# Patient Record
Sex: Female | Born: 1955 | Race: White | Hispanic: No | Smoking: Former smoker
Health system: Southern US, Community
[De-identification: ages and names within clinical notes are randomized; demographics above are authoritative.]

## PROBLEM LIST (undated history)

## (undated) DIAGNOSIS — F329 Major depressive disorder, single episode, unspecified: Secondary | ICD-10-CM

## (undated) DIAGNOSIS — R0600 Dyspnea, unspecified: Secondary | ICD-10-CM

## (undated) DIAGNOSIS — J449 Chronic obstructive pulmonary disease, unspecified: Secondary | ICD-10-CM

## (undated) DIAGNOSIS — F32A Depression, unspecified: Secondary | ICD-10-CM

## (undated) DIAGNOSIS — Z9889 Other specified postprocedural states: Secondary | ICD-10-CM

## (undated) DIAGNOSIS — T4145XA Adverse effect of unspecified anesthetic, initial encounter: Secondary | ICD-10-CM

## (undated) DIAGNOSIS — R112 Nausea with vomiting, unspecified: Secondary | ICD-10-CM

## (undated) DIAGNOSIS — T8859XA Other complications of anesthesia, initial encounter: Secondary | ICD-10-CM

## (undated) DIAGNOSIS — M1990 Unspecified osteoarthritis, unspecified site: Secondary | ICD-10-CM

## (undated) HISTORY — PX: CHOLECYSTECTOMY: SHX55

## (undated) HISTORY — PX: TUBAL LIGATION: SHX77

## (undated) HISTORY — PX: EAR CANALOPLASTY: SHX1481

---

## 2016-03-05 HISTORY — PX: KNEE ARTHROSCOPY: SHX127

## 2016-06-30 ENCOUNTER — Ambulatory Visit: Payer: Self-pay | Admitting: Physician Assistant

## 2016-07-11 ENCOUNTER — Encounter: Payer: Worker's Compensation | Admitting: Vascular Surgery

## 2016-07-12 ENCOUNTER — Encounter (HOSPITAL_COMMUNITY): Payer: Self-pay | Admitting: *Deleted

## 2016-07-12 ENCOUNTER — Encounter (HOSPITAL_COMMUNITY)
Admission: RE | Admit: 2016-07-12 | Discharge: 2016-07-12 | Disposition: A | Discharge status: Home / Self Care | Payer: Worker's Compensation | Source: Ambulatory Visit | Attending: Orthopedic Surgery | Admitting: Orthopedic Surgery

## 2016-07-12 ENCOUNTER — Other Ambulatory Visit: Payer: Self-pay

## 2016-07-12 DIAGNOSIS — Z01818 Encounter for other preprocedural examination: Secondary | ICD-10-CM | POA: Diagnosis not present

## 2016-07-12 DIAGNOSIS — M5137 Other intervertebral disc degeneration, lumbosacral region: Secondary | ICD-10-CM | POA: Insufficient documentation

## 2016-07-12 HISTORY — DX: Chronic obstructive pulmonary disease, unspecified: J44.9

## 2016-07-12 HISTORY — DX: Unspecified osteoarthritis, unspecified site: M19.90

## 2016-07-12 HISTORY — DX: Other specified postprocedural states: Z98.890

## 2016-07-12 HISTORY — DX: Nausea with vomiting, unspecified: R11.2

## 2016-07-12 HISTORY — DX: Dyspnea, unspecified: R06.00

## 2016-07-12 HISTORY — DX: Other complications of anesthesia, initial encounter: T88.59XA

## 2016-07-12 HISTORY — DX: Adverse effect of unspecified anesthetic, initial encounter: T41.45XA

## 2016-07-12 LAB — BASIC METABOLIC PANEL
ANION GAP: 9 (ref 5–15)
BUN: 5 mg/dL — ABNORMAL LOW (ref 6–20)
CALCIUM: 9.5 mg/dL (ref 8.9–10.3)
CO2: 27 mmol/L (ref 22–32)
Chloride: 102 mmol/L (ref 101–111)
Creatinine, Ser: 0.73 mg/dL (ref 0.44–1.00)
GLUCOSE: 101 mg/dL — AB (ref 65–99)
POTASSIUM: 3.7 mmol/L (ref 3.5–5.1)
Sodium: 138 mmol/L (ref 135–145)

## 2016-07-12 LAB — SURGICAL PCR SCREEN
MRSA, PCR: NEGATIVE
STAPHYLOCOCCUS AUREUS: NEGATIVE

## 2016-07-12 LAB — CBC
HEMATOCRIT: 41.6 % (ref 36.0–46.0)
Hemoglobin: 14.2 g/dL (ref 12.0–15.0)
MCH: 33.3 pg (ref 26.0–34.0)
MCHC: 34.1 g/dL (ref 30.0–36.0)
MCV: 97.7 fL (ref 78.0–100.0)
PLATELETS: 386 10*3/uL (ref 150–400)
RBC: 4.26 MIL/uL (ref 3.87–5.11)
RDW: 13.2 % (ref 11.5–15.5)
WBC: 6.7 10*3/uL (ref 4.0–10.5)

## 2016-07-12 LAB — TYPE AND SCREEN
ABO/RH(D): A POS
ANTIBODY SCREEN: NEGATIVE

## 2016-07-12 LAB — ABO/RH: ABO/RH(D): A POS

## 2016-07-12 NOTE — Progress Notes (Signed)
Spoke with sherry at dr Shon Batonbrooks office re: [facility] assist- she will call [facility] office and remind to put in orders for dr early

## 2016-07-12 NOTE — Pre-Procedure Instructions (Addendum)
Catherine Lang  07/12/2016      [facility] [place] [place] - [address] [place] [address] Phone: [phone] Fax: [phone]    Your procedure is scheduled on 07/19/16  [facility] at 630 A.M.  Call this number if you have problems the morning of surgery:  [phone]   Remember:  Do not eat food or drink liquids after midnight.  Take these medicines the morning of surgery with A SIP OF WATER     Tylenol if needed  STOP all herbel meds, nsaids (aleve,naproxen,advil,ibuprofen)  Starting Today including all vitamins/supplements, aspirin,celebrex,probiotic   Do not wear jewelry, make-up or nail polish.  Do not wear lotions, powders, or perfumes, or deoderant.  Do not shave 48 hours prior to surgery.  Men may shave face and neck.  Do not bring valuables to the hospital.  [facility] is not responsible for any belongings or valuables.  Contacts, dentures or bridgework may not be worn into surgery.  Leave your suitcase in the car.  After surgery it may be brought to your room.  For patients admitted to the hospital, discharge time will be determined by your treatment team.  Patients discharged the day of surgery will not be allowed to drive home.   Special instructions:   Special Instructions:  - Preparing for Surgery  Before surgery, you can play an important role.  Because skin is not sterile, your skin needs to be as free of germs as possible.  You can reduce the number of germs on you skin by washing with CHG (chlorahexidine gluconate) soap before surgery.  CHG is an antiseptic cleaner which kills germs and bonds with the skin to continue killing germs even after washing.  Please DO NOT use if you have an allergy to CHG or antibacterial soaps.  If your skin becomes reddened/irritated stop using the CHG and inform your nurse when you arrive at Short Stay.  Do not shave  (including legs and underarms) for at least 48 hours prior to the first CHG shower.  You may shave your face.  Please follow these instructions carefully:   1.  Shower with CHG Soap the night before surgery and the morning of Surgery.  2.  If you choose to wash your hair, wash your hair first as usual with your normal shampoo.  3.  After you shampoo, rinse your hair and body thoroughly to remove the Shampoo.  4.  Use CHG as you would any other liquid soap.  You can apply chg directly  to the skin and wash gently with scrungie or a clean washcloth.  5.  Apply the CHG Soap to your body ONLY FROM THE NECK DOWN.  Do not use on open wounds or open sores.  Avoid contact with your eyes ears, mouth and genitals (private parts).  Wash genitals (private parts)       with your normal soap.  6.  Wash thoroughly, paying special attention to the area where your surgery will be performed.  7.  Thoroughly rinse your body with warm water from the neck down.  8.  DO NOT shower/wash with your normal soap after using and rinsing off the CHG Soap.  9.  Pat yourself dry with a clean towel.            10.  Wear clean pajamas.            11.  Place  clean sheets on your bed the night of your first shower and do not sleep with pets.  Day of Surgery  Do not apply any lotions/deodorants the morning of surgery.  Please wear clean clothes to the hospital/surgery center.  Please read over the  fact sheets that you were given.

## 2016-07-18 ENCOUNTER — Ambulatory Visit (INDEPENDENT_AMBULATORY_CARE_PROVIDER_SITE_OTHER): Payer: Worker's Compensation | Admitting: Vascular Surgery

## 2016-07-18 ENCOUNTER — Encounter: Payer: Self-pay | Admitting: Vascular Surgery

## 2016-07-18 VITALS — BP 122/77 | HR 92 | Temp 98.1°F | Resp 18 | Ht 62.0 in | Wt 129.8 lb

## 2016-07-18 DIAGNOSIS — M5137 Other intervertebral disc degeneration, lumbosacral region: Secondary | ICD-10-CM | POA: Diagnosis not present

## 2016-07-18 NOTE — Progress Notes (Signed)
[facility]  Patient name: Catherine Lang MRN: [id number] DOB: [birth year] Sex: female  REASON FOR CONSULT: Discuss anterior exposure for L5-S1 disc surgery  HPI: Catherine Lang is a 61 y.o. female, who is seen today for discussion of my role for anterior exposure for L5-S1 disc surgery. She is seen Dr. Shon BatonBrooks who is recommended anterior repair. She had a injury while at work with the fall from a ladder. She had a knee injury and also back injury. She has failed conservative treatment. She has chronic pain in her back but also particularly down both legs more so left than her right. She has failed conservative treatment. Does have a history of tubal ligation with a low Pfannenstiel incision. Also history of cholecystectomy in the mid 1970s with a right paramedian incision. Has no history of cardiac disease. Does have a history of COPD  Past Medical History:  Diagnosis Date  . Arthritis   . Complication of anesthesia    yrs ago had Nausea but no problem in 10/17  . COPD (chronic obstructive pulmonary disease) (HCC)    when cough hard throw up- 1-2 tx week  . Dyspnea    with exersion  . PONV (postoperative nausea and vomiting)     History reviewed. No pertinent family history.  SOCIAL HISTORY: Social History   Social History  . Marital status: Widowed    Spouse name: N/A  . Number of children: N/A  . Years of education: N/A   Occupational History  . Not on file.   Social History Main Topics  . Smoking status: Former Smoker    Packs/day: 1.50    Years: 30.00    Quit date: 07/12/2013  . Smokeless tobacco: Never Used  . Alcohol use 10.8 oz/week    18 Cans of beer per week  . Drug use: No  . Sexual activity: Not on file   Other Topics Concern  . Not on file   Social History Narrative  . No narrative on file    Allergies  Allergen Reactions  . Tramadol Itching  . Hydrocodone Itching    Current Outpatient  Prescriptions  Medication Sig Dispense Refill  . acetaminophen (TYLENOL) 500 MG tablet Take 1,000 mg by mouth every 8 (eight) hours as needed for mild pain or moderate pain.    . celecoxib (CELEBREX) 200 MG capsule Take 200 mg by mouth at bedtime.    . Probiotic Product (PROBIOTIC DAILY PO) Take 1 capsule by mouth daily.     No current facility-administered medications for this visit.     REVIEW OF SYSTEMS:  [X]  denotes positive finding, [ ]  denotes negative finding Cardiac  Comments:  Chest pain or chest pressure:    Shortness of breath upon exertion:    Short of breath when lying flat:    Irregular heart rhythm:        Vascular    Pain in calf, thigh, or hip brought on by ambulation:    Pain in feet at night that wakes you up from your sleep:     Blood clot in your veins:    Leg swelling:         Pulmonary    Oxygen at home:    Productive cough:     Wheezing:         Neurologic    Sudden weakness in arms or legs:     Sudden numbness in arms or legs:     Sudden onset of  difficulty speaking or slurred speech:    Temporary loss of vision in one eye:     Problems with dizziness:         Gastrointestinal    Blood in stool:     Vomited blood:         Genitourinary    Burning when urinating:     Blood in urine:        Psychiatric    Major depression:         Hematologic    Bleeding problems:    Problems with blood clotting too easily:        Skin    Rashes or ulcers:        Constitutional    Fever or chills:      PHYSICAL EXAM: Vitals:   07/18/16 1101  BP: 122/77  Pulse: 92  Resp: 18  Temp: 98.1 F (36.7 C)  TempSrc: Oral  SpO2: 98%  Weight: 129 lb 12.8 oz (58.9 kg)  Height: 5\' 2"  (1.575 m)    GENERAL: The patient is a well-nourished female, in no acute distress. The vital signs are documented above. CARDIOVASCULAR: Carotid arteries without bruits bilaterally. 2+ radial and 2+ dorsalis pedis pulses bilaterally PULMONARY: There is good air exchange    ABDOMEN: Soft and non-tender . Mild obesity. Well-healed right paramedian incision MUSCULOSKELETAL: There are no major deformities or cyanosis. NEUROLOGIC: No focal weakness or paresthesias are detected. SKIN: There are no ulcers or rashes noted. PSYCHIATRIC: The patient has a normal affect.   MEDICAL ISSUES: Had long discussion with patient and her daughter present. Explained my role for exposure for L5-S1 disc surgery. Explained mobilization of the rectus muscle and entering the peritoneum. Explain mobilization of the left ureter and great vessels overlying the spine. Explain the potential for injury to all of these. She understands and wishes to proceed with surgery as scheduled tomorrow   Larina Earthly, MD [place]  [facility] Tel [phone] Pager [phone]

## 2016-07-19 ENCOUNTER — Inpatient Hospital Stay (HOSPITAL_COMMUNITY)
Admission: RE | Admit: 2016-07-19 | Discharge: 2016-07-20 | DRG: 460 | Disposition: A | Discharge status: Home / Self Care | Payer: Worker's Compensation | Source: Ambulatory Visit | Attending: Orthopedic Surgery | Admitting: Orthopedic Surgery

## 2016-07-19 ENCOUNTER — Inpatient Hospital Stay (HOSPITAL_COMMUNITY): Payer: Worker's Compensation

## 2016-07-19 ENCOUNTER — Encounter (HOSPITAL_COMMUNITY): Payer: Self-pay | Admitting: Urology

## 2016-07-19 ENCOUNTER — Inpatient Hospital Stay (HOSPITAL_COMMUNITY): Payer: Worker's Compensation | Admitting: Certified Registered"

## 2016-07-19 ENCOUNTER — Encounter (HOSPITAL_COMMUNITY)
Admission: RE | Disposition: A | Discharge status: Home / Self Care | Payer: Self-pay | Source: Ambulatory Visit | Attending: Orthopedic Surgery

## 2016-07-19 DIAGNOSIS — M549 Dorsalgia, unspecified: Secondary | ICD-10-CM | POA: Diagnosis present

## 2016-07-19 DIAGNOSIS — Z8261 Family history of arthritis: Secondary | ICD-10-CM | POA: Diagnosis not present

## 2016-07-19 DIAGNOSIS — Z79899 Other long term (current) drug therapy: Secondary | ICD-10-CM

## 2016-07-19 DIAGNOSIS — Z981 Arthrodesis status: Secondary | ICD-10-CM

## 2016-07-19 DIAGNOSIS — M5137 Other intervertebral disc degeneration, lumbosacral region: Principal | ICD-10-CM | POA: Diagnosis present

## 2016-07-19 DIAGNOSIS — M2578 Osteophyte, vertebrae: Secondary | ICD-10-CM | POA: Diagnosis present

## 2016-07-19 DIAGNOSIS — M1712 Unilateral primary osteoarthritis, left knee: Secondary | ICD-10-CM | POA: Diagnosis present

## 2016-07-19 DIAGNOSIS — J449 Chronic obstructive pulmonary disease, unspecified: Secondary | ICD-10-CM | POA: Diagnosis present

## 2016-07-19 DIAGNOSIS — Z87891 Personal history of nicotine dependence: Secondary | ICD-10-CM

## 2016-07-19 DIAGNOSIS — Z8249 Family history of ischemic heart disease and other diseases of the circulatory system: Secondary | ICD-10-CM

## 2016-07-19 DIAGNOSIS — Z825 Family history of asthma and other chronic lower respiratory diseases: Secondary | ICD-10-CM | POA: Diagnosis not present

## 2016-07-19 DIAGNOSIS — Z8262 Family history of osteoporosis: Secondary | ICD-10-CM

## 2016-07-19 DIAGNOSIS — M5442 Lumbago with sciatica, left side: Secondary | ICD-10-CM | POA: Diagnosis present

## 2016-07-19 DIAGNOSIS — Z419 Encounter for procedure for purposes other than remedying health state, unspecified: Secondary | ICD-10-CM

## 2016-07-19 HISTORY — PX: ANTERIOR LUMBAR FUSION: SHX1170

## 2016-07-19 HISTORY — PX: ABDOMINAL EXPOSURE: SHX5708

## 2016-07-19 LAB — CBC
HEMATOCRIT: 41.3 % (ref 36.0–46.0)
Hemoglobin: 13.8 g/dL (ref 12.0–15.0)
MCH: 33.1 pg (ref 26.0–34.0)
MCHC: 33.4 g/dL (ref 30.0–36.0)
MCV: 99 fL (ref 78.0–100.0)
Platelets: 353 10*3/uL (ref 150–400)
RBC: 4.17 MIL/uL (ref 3.87–5.11)
RDW: 13.3 % (ref 11.5–15.5)
WBC: 13.4 10*3/uL — ABNORMAL HIGH (ref 4.0–10.5)

## 2016-07-19 LAB — CREATININE, SERUM
Creatinine, Ser: 0.76 mg/dL (ref 0.44–1.00)
GFR calc non Af Amer: >60 mL/min (ref >60)

## 2016-07-19 SURGERY — ANTERIOR LUMBAR FUSION 1 LEVEL
Anesthesia: General

## 2016-07-19 MED ORDER — MENTHOL 3 MG MT LOZG: 1.0000 | LOZENGE | OROMUCOSAL | Status: DC | PRN

## 2016-07-19 MED ORDER — OXYCODONE HCL 5 MG PO TABS
10.0000 mg | ORAL_TABLET | ORAL | Status: DC | PRN
  Administered 2016-07-19 – 2016-07-20 (×4): 10 mg via ORAL
  Filled 2016-07-19 (×4): qty 2

## 2016-07-19 MED ORDER — ENOXAPARIN SODIUM 40 MG/0.4ML ~~LOC~~ SOLN: 40.0000 mg | SUBCUTANEOUS | 0 refills | Status: DC

## 2016-07-19 MED ORDER — POLYETHYLENE GLYCOL 3350 17 G PO PACK: 17.0000 g | PACK | Freq: Every day | ORAL | Status: DC | PRN

## 2016-07-19 MED ORDER — FENTANYL CITRATE (PF) 100 MCG/2ML IJ SOLN
25.0000 ug | INTRAMUSCULAR | Status: DC | PRN
  Administered 2016-07-19 (×2): 50 ug via INTRAVENOUS

## 2016-07-19 MED ORDER — ACETAMINOPHEN 325 MG PO TABS: 650.0000 mg | ORAL_TABLET | ORAL | Status: DC | PRN

## 2016-07-19 MED ORDER — METHOCARBAMOL 1000 MG/10ML IJ SOLN
500.0000 mg | Freq: Four times a day (QID) | INTRAVENOUS | Status: DC | PRN
  Filled 2016-07-19: qty 5

## 2016-07-19 MED ORDER — SODIUM CHLORIDE 0.9% FLUSH: 3.0000 mL | Freq: Two times a day (BID) | INTRAVENOUS | Status: DC

## 2016-07-19 MED ORDER — LACTATED RINGERS IV SOLN: INTRAVENOUS | Status: DC

## 2016-07-19 MED ORDER — MAGNESIUM CITRATE PO SOLN
0.5000 | Freq: Once | ORAL | Status: AC
  Administered 2016-07-20: 0.5 via ORAL
  Filled 2016-07-19: qty 296

## 2016-07-19 MED ORDER — THROMBIN 20000 UNITS EX SOLR
CUTANEOUS | Status: AC
  Filled 2016-07-19: qty 20000

## 2016-07-19 MED ORDER — SODIUM CHLORIDE 0.9% FLUSH: 3.0000 mL | INTRAVENOUS | Status: DC | PRN

## 2016-07-19 MED ORDER — CHLORHEXIDINE GLUCONATE 4 % EX LIQD: 60.0000 mL | Freq: Once | CUTANEOUS | Status: DC

## 2016-07-19 MED ORDER — SUGAMMADEX SODIUM 200 MG/2ML IV SOLN
INTRAVENOUS | Status: DC | PRN
  Administered 2016-07-19: 120 mg via INTRAVENOUS

## 2016-07-19 MED ORDER — BUPIVACAINE HCL (PF) 0.25 % IJ SOLN
INTRAMUSCULAR | Status: AC
  Filled 2016-07-19: qty 30

## 2016-07-19 MED ORDER — ENOXAPARIN SODIUM 30 MG/0.3ML ~~LOC~~ SOLN: 30.0000 mg | SUBCUTANEOUS | Status: DC

## 2016-07-19 MED ORDER — OXYCODONE-ACETAMINOPHEN 10-325 MG PO TABS: 1.0000 | ORAL_TABLET | ORAL | 0 refills | Status: DC | PRN

## 2016-07-19 MED ORDER — CEFAZOLIN SODIUM-DEXTROSE 2-4 GM/100ML-% IV SOLN
2.0000 g | INTRAVENOUS | Status: AC
  Administered 2016-07-19: 2 g via INTRAVENOUS

## 2016-07-19 MED ORDER — PHENYLEPHRINE HCL 10 MG/ML IJ SOLN
INTRAMUSCULAR | Status: DC | PRN
  Administered 2016-07-19 (×2): 40 ug via INTRAVENOUS
  Administered 2016-07-19: 80 ug via INTRAVENOUS
  Administered 2016-07-19: 40 ug via INTRAVENOUS
  Administered 2016-07-19: 80 ug via INTRAVENOUS

## 2016-07-19 MED ORDER — OXYCODONE-ACETAMINOPHEN 5-325 MG PO TABS: 1.0000 | ORAL_TABLET | ORAL | Status: DC | PRN

## 2016-07-19 MED ORDER — DOCUSATE SODIUM 100 MG PO CAPS: 100.0000 mg | ORAL_CAPSULE | Freq: Two times a day (BID) | ORAL | Status: DC

## 2016-07-19 MED ORDER — MORPHINE SULFATE (PF) 2 MG/ML IV SOLN: 2.0000 mg | INTRAVENOUS | Status: DC | PRN

## 2016-07-19 MED ORDER — ROCURONIUM BROMIDE 50 MG/5ML IV SOSY
PREFILLED_SYRINGE | INTRAVENOUS | Status: AC
  Filled 2016-07-19: qty 5

## 2016-07-19 MED ORDER — DEXAMETHASONE 4 MG PO TABS
4.0000 mg | ORAL_TABLET | Freq: Four times a day (QID) | ORAL | Status: AC
  Administered 2016-07-19: 4 mg via ORAL
  Filled 2016-07-19: qty 1

## 2016-07-19 MED ORDER — DEXAMETHASONE SODIUM PHOSPHATE 4 MG/ML IJ SOLN
4.0000 mg | Freq: Four times a day (QID) | INTRAMUSCULAR | Status: AC
  Administered 2016-07-20: 4 mg via INTRAVENOUS
  Filled 2016-07-19 (×2): qty 1

## 2016-07-19 MED ORDER — ROCURONIUM BROMIDE 10 MG/ML (PF) SYRINGE
PREFILLED_SYRINGE | INTRAVENOUS | Status: DC | PRN
  Administered 2016-07-19: 20 mg via INTRAVENOUS
  Administered 2016-07-19: 10 mg via INTRAVENOUS
  Administered 2016-07-19: 50 mg via INTRAVENOUS
  Administered 2016-07-19: 10 mg via INTRAVENOUS

## 2016-07-19 MED ORDER — ACETAMINOPHEN 10 MG/ML IV SOLN
1000.0000 mg | Freq: Four times a day (QID) | INTRAVENOUS | Status: DC
  Administered 2016-07-19 – 2016-07-20 (×3): 1000 mg via INTRAVENOUS
  Filled 2016-07-19 (×4): qty 100

## 2016-07-19 MED ORDER — ONDANSETRON HCL 4 MG/2ML IJ SOLN: 4.0000 mg | Freq: Once | INTRAMUSCULAR | Status: DC | PRN

## 2016-07-19 MED ORDER — CEFAZOLIN SODIUM-DEXTROSE 2-4 GM/100ML-% IV SOLN
2.0000 g | Freq: Three times a day (TID) | INTRAVENOUS | Status: DC
  Filled 2016-07-19: qty 100

## 2016-07-19 MED ORDER — MAGNESIUM CITRATE PO SOLN: 0.5000 | Freq: Once | ORAL | Status: DC

## 2016-07-19 MED ORDER — ACETAMINOPHEN 10 MG/ML IV SOLN
INTRAVENOUS | Status: AC
  Filled 2016-07-19: qty 100

## 2016-07-19 MED ORDER — THROMBIN 20000 UNITS EX SOLR
OROMUCOSAL | Status: DC | PRN
  Administered 2016-07-19: 11:00:00 via TOPICAL

## 2016-07-19 MED ORDER — OXYCODONE HCL 5 MG PO TABS
10.0000 mg | ORAL_TABLET | ORAL | Status: DC | PRN
Start: 2016-07-19 — End: 2016-07-19
  Administered 2016-07-19: 10 mg via ORAL

## 2016-07-19 MED ORDER — FENTANYL CITRATE (PF) 100 MCG/2ML IJ SOLN
INTRAMUSCULAR | Status: AC
  Filled 2016-07-19: qty 2

## 2016-07-19 MED ORDER — ACETAMINOPHEN 650 MG RE SUPP: 650.0000 mg | RECTAL | Status: DC | PRN

## 2016-07-19 MED ORDER — ACETAMINOPHEN 10 MG/ML IV SOLN: 1000.0000 mg | Freq: Four times a day (QID) | INTRAVENOUS | Status: DC

## 2016-07-19 MED ORDER — ENOXAPARIN SODIUM 40 MG/0.4ML ~~LOC~~ SOLN: 40.0000 mg | SUBCUTANEOUS | Status: DC

## 2016-07-19 MED ORDER — LIDOCAINE 2% (20 MG/ML) 5 ML SYRINGE
INTRAMUSCULAR | Status: DC | PRN
  Administered 2016-07-19: 60 mg via INTRAVENOUS

## 2016-07-19 MED ORDER — ONDANSETRON HCL 4 MG/2ML IJ SOLN
4.0000 mg | INTRAMUSCULAR | Status: DC | PRN
Start: 2016-07-19 — End: 2016-07-19

## 2016-07-19 MED ORDER — PHENYLEPHRINE HCL 10 MG/ML IJ SOLN
INTRAVENOUS | Status: DC | PRN
  Administered 2016-07-19: 25 ug/min via INTRAVENOUS

## 2016-07-19 MED ORDER — PHENOL 1.4 % MT LIQD: 1.0000 | OROMUCOSAL | Status: DC | PRN

## 2016-07-19 MED ORDER — ONDANSETRON HCL 4 MG/2ML IJ SOLN
4.0000 mg | INTRAMUSCULAR | Status: DC | PRN
  Administered 2016-07-19 – 2016-07-20 (×4): 4 mg via INTRAVENOUS
  Filled 2016-07-19 (×4): qty 2

## 2016-07-19 MED ORDER — METHOCARBAMOL 500 MG PO TABS: 500.0000 mg | ORAL_TABLET | Freq: Three times a day (TID) | ORAL | 0 refills | Status: DC | PRN

## 2016-07-19 MED ORDER — DEXAMETHASONE SODIUM PHOSPHATE 10 MG/ML IJ SOLN
INTRAMUSCULAR | Status: DC | PRN
  Administered 2016-07-19: 10 mg via INTRAVENOUS

## 2016-07-19 MED ORDER — ENOXAPARIN SODIUM 40 MG/0.4ML ~~LOC~~ SOLN
40.0000 mg | SUBCUTANEOUS | Status: DC
  Administered 2016-07-20: 40 mg via SUBCUTANEOUS
  Filled 2016-07-19: qty 0.4

## 2016-07-19 MED ORDER — SODIUM CHLORIDE 0.9 % IV SOLN: 250.0000 mL | INTRAVENOUS | Status: DC

## 2016-07-19 MED ORDER — OXYCODONE-ACETAMINOPHEN 5-325 MG PO TABS
1.0000 | ORAL_TABLET | ORAL | Status: DC | PRN
  Administered 2016-07-20: 2 via ORAL
  Filled 2016-07-19: qty 2

## 2016-07-19 MED ORDER — SUGAMMADEX SODIUM 200 MG/2ML IV SOLN
INTRAVENOUS | Status: AC
  Filled 2016-07-19: qty 2

## 2016-07-19 MED ORDER — FLEET ENEMA 7-19 GM/118ML RE ENEM
1.0000 | ENEMA | Freq: Once | RECTAL | Status: AC
  Administered 2016-07-20: 1 via RECTAL
  Filled 2016-07-19: qty 1

## 2016-07-19 MED ORDER — SCOPOLAMINE 1 MG/3DAYS TD PT72
MEDICATED_PATCH | TRANSDERMAL | Status: AC
  Filled 2016-07-19: qty 1

## 2016-07-19 MED ORDER — ACETAMINOPHEN 10 MG/ML IV SOLN
INTRAVENOUS | Status: DC | PRN
  Administered 2016-07-19: 1000 mg via INTRAVENOUS

## 2016-07-19 MED ORDER — MIDAZOLAM HCL 2 MG/2ML IJ SOLN
INTRAMUSCULAR | Status: AC
  Filled 2016-07-19: qty 2

## 2016-07-19 MED ORDER — LACTATED RINGERS IV SOLN
INTRAVENOUS | Status: DC | PRN
  Administered 2016-07-19 (×2): via INTRAVENOUS

## 2016-07-19 MED ORDER — PHENYLEPHRINE 40 MCG/ML (10ML) SYRINGE FOR IV PUSH (FOR BLOOD PRESSURE SUPPORT)
PREFILLED_SYRINGE | INTRAVENOUS | Status: AC
  Filled 2016-07-19: qty 10

## 2016-07-19 MED ORDER — DEXAMETHASONE SODIUM PHOSPHATE 4 MG/ML IJ SOLN
4.0000 mg | Freq: Four times a day (QID) | INTRAMUSCULAR | Status: DC
  Administered 2016-07-19: 4 mg via INTRAVENOUS
  Filled 2016-07-19: qty 1

## 2016-07-19 MED ORDER — LIDOCAINE 2% (20 MG/ML) 5 ML SYRINGE
INTRAMUSCULAR | Status: AC
  Filled 2016-07-19: qty 5

## 2016-07-19 MED ORDER — CEFAZOLIN SODIUM-DEXTROSE 2-4 GM/100ML-% IV SOLN
2.0000 g | Freq: Three times a day (TID) | INTRAVENOUS | Status: AC
  Administered 2016-07-19 – 2016-07-20 (×2): 2 g via INTRAVENOUS
  Filled 2016-07-19 (×2): qty 100

## 2016-07-19 MED ORDER — METHOCARBAMOL 500 MG PO TABS
500.0000 mg | ORAL_TABLET | Freq: Four times a day (QID) | ORAL | Status: DC | PRN
  Administered 2016-07-19: 500 mg via ORAL

## 2016-07-19 MED ORDER — ONDANSETRON HCL 4 MG/2ML IJ SOLN
INTRAMUSCULAR | Status: AC
  Filled 2016-07-19: qty 2

## 2016-07-19 MED ORDER — DOCUSATE SODIUM 100 MG PO CAPS
100.0000 mg | ORAL_CAPSULE | Freq: Two times a day (BID) | ORAL | Status: DC
  Administered 2016-07-19 – 2016-07-20 (×2): 100 mg via ORAL
  Filled 2016-07-19 (×2): qty 1

## 2016-07-19 MED ORDER — FLEET ENEMA 7-19 GM/118ML RE ENEM: 1.0000 | ENEMA | Freq: Once | RECTAL | Status: DC

## 2016-07-19 MED ORDER — MIDAZOLAM HCL 2 MG/2ML IJ SOLN
INTRAMUSCULAR | Status: DC | PRN
  Administered 2016-07-19: 1 mg via INTRAVENOUS

## 2016-07-19 MED ORDER — EPINEPHRINE PF 1 MG/ML IJ SOLN
INTRAMUSCULAR | Status: AC
  Filled 2016-07-19: qty 1

## 2016-07-19 MED ORDER — PROPOFOL 10 MG/ML IV BOLUS
INTRAVENOUS | Status: DC | PRN
  Administered 2016-07-19: 160 mg via INTRAVENOUS

## 2016-07-19 MED ORDER — ONDANSETRON HCL 4 MG PO TABS: 4.0000 mg | ORAL_TABLET | Freq: Three times a day (TID) | ORAL | 0 refills | Status: AC | PRN

## 2016-07-19 MED ORDER — ONDANSETRON HCL 4 MG/2ML IJ SOLN
INTRAMUSCULAR | Status: DC | PRN
  Administered 2016-07-19: 4 mg via INTRAVENOUS

## 2016-07-19 MED ORDER — PROPOFOL 10 MG/ML IV BOLUS
INTRAVENOUS | Status: AC
  Filled 2016-07-19: qty 20

## 2016-07-19 MED ORDER — FENTANYL CITRATE (PF) 100 MCG/2ML IJ SOLN
INTRAMUSCULAR | Status: DC | PRN
  Administered 2016-07-19: 100 ug via INTRAVENOUS
  Administered 2016-07-19 (×3): 50 ug via INTRAVENOUS

## 2016-07-19 MED ORDER — 0.9 % SODIUM CHLORIDE (POUR BTL) OPTIME
TOPICAL | Status: DC | PRN
  Administered 2016-07-19: 1000 mL

## 2016-07-19 MED ORDER — SCOPOLAMINE 1 MG/3DAYS TD PT72
MEDICATED_PATCH | TRANSDERMAL | Status: DC | PRN
  Administered 2016-07-19: 1 via TRANSDERMAL

## 2016-07-19 MED ORDER — METHOCARBAMOL 500 MG PO TABS
500.0000 mg | ORAL_TABLET | Freq: Four times a day (QID) | ORAL | Status: DC | PRN
  Administered 2016-07-19: 500 mg via ORAL
  Filled 2016-07-19 (×2): qty 1

## 2016-07-19 MED ORDER — DEXAMETHASONE 4 MG PO TABS: 4.0000 mg | ORAL_TABLET | Freq: Four times a day (QID) | ORAL | Status: DC

## 2016-07-19 SURGICAL SUPPLY — 98 items
APPLIER CLIP 11 MED OPEN (CLIP) ×3
BLADE SURG 10 STRL SS (BLADE) ×3 IMPLANT
BLADE SURG ROTATE 9660 (MISCELLANEOUS) IMPLANT
BONE VIVIGEN FORMABLE 5.4CC (Bone Implant) ×3 IMPLANT
CLIP APPLIE 11 MED OPEN (CLIP) ×1 IMPLANT
CLIP LIGATING EXTRA MED SLVR (CLIP) ×3 IMPLANT
CLIP LIGATING EXTRA SM BLUE (MISCELLANEOUS) ×3 IMPLANT
CLOSURE WOUND 1/2 X4 (GAUZE/BANDAGES/DRESSINGS) ×1
CORDS BIPOLAR (ELECTRODE) ×3 IMPLANT
COVER SURGICAL LIGHT HANDLE (MISCELLANEOUS) ×3 IMPLANT
DERMABOND ADVANCED (GAUZE/BANDAGES/DRESSINGS) ×2
DERMABOND ADVANCED .7 DNX12 (GAUZE/BANDAGES/DRESSINGS) ×1 IMPLANT
DRAPE C-ARM 42X72 X-RAY (DRAPES) ×6 IMPLANT
DRAPE INCISE IOBAN 66X45 STRL (DRAPES) IMPLANT
DRAPE POUCH INSTRU U-SHP 10X18 (DRAPES) ×3 IMPLANT
DRAPE SURG 17X23 STRL (DRAPES) ×3 IMPLANT
DRAPE U-SHAPE 47X51 STRL (DRAPES) ×3 IMPLANT
DRESSING AQUACEL AG SP 3.5X10 (GAUZE/BANDAGES/DRESSINGS) ×1 IMPLANT
DRSG AQUACEL AG SP 3.5X10 (GAUZE/BANDAGES/DRESSINGS) ×3
DRSG MEPILEX BORDER 4X8 (GAUZE/BANDAGES/DRESSINGS) ×3 IMPLANT
DURAPREP 26ML APPLICATOR (WOUND CARE) ×3 IMPLANT
ELECT BLADE 4.0 EZ CLEAN MEGAD (MISCELLANEOUS) ×3
ELECT CAUTERY BLADE 6.4 (BLADE) ×3 IMPLANT
ELECT PENCIL ROCKER SW 15FT (MISCELLANEOUS) ×3 IMPLANT
ELECT REM PT RETURN 9FT ADLT (ELECTROSURGICAL) ×3
ELECTRODE BLDE 4.0 EZ CLN MEGD (MISCELLANEOUS) ×1 IMPLANT
ELECTRODE REM PT RTRN 9FT ADLT (ELECTROSURGICAL) ×1 IMPLANT
GAUZE SPONGE 4X4 16PLY XRAY LF (GAUZE/BANDAGES/DRESSINGS) IMPLANT
GLOVE BIO SURGEON STRL SZ 6.5 (GLOVE) ×2 IMPLANT
GLOVE BIO SURGEON STRL SZ7.5 (GLOVE) IMPLANT
GLOVE BIO SURGEONS STRL SZ 6.5 (GLOVE) ×1
GLOVE BIOGEL PI IND STRL 6.5 (GLOVE) ×1 IMPLANT
GLOVE BIOGEL PI IND STRL 8.5 (GLOVE) ×2 IMPLANT
GLOVE BIOGEL PI INDICATOR 6.5 (GLOVE) ×2
GLOVE BIOGEL PI INDICATOR 8.5 (GLOVE) ×4
GLOVE SS BIOGEL STRL SZ 7.5 (GLOVE) ×1 IMPLANT
GLOVE SS BIOGEL STRL SZ 8.5 (GLOVE) ×2 IMPLANT
GLOVE SUPERSENSE BIOGEL SZ 7.5 (GLOVE) ×2
GLOVE SUPERSENSE BIOGEL SZ 8.5 (GLOVE) ×4
GOWN STRL REUS W/ TWL LRG LVL3 (GOWN DISPOSABLE) ×4 IMPLANT
GOWN STRL REUS W/TWL 2XL LVL3 (GOWN DISPOSABLE) ×9 IMPLANT
GOWN STRL REUS W/TWL LRG LVL3 (GOWN DISPOSABLE) ×8
HEMOSTAT SNOW SURGICEL 2X4 (HEMOSTASIS) IMPLANT
INSERT FOGARTY 61MM (MISCELLANEOUS) IMPLANT
INSERT FOGARTY SM (MISCELLANEOUS) IMPLANT
INTERPLATE 39X14X8 (Plate) ×3 IMPLANT
KIT BASIN OR (CUSTOM PROCEDURE TRAY) ×3 IMPLANT
KIT ROOM TURNOVER OR (KITS) ×6 IMPLANT
LOOP VESSEL MAXI BLUE (MISCELLANEOUS) IMPLANT
LOOP VESSEL MINI RED (MISCELLANEOUS) IMPLANT
NEEDLE SPNL 18GX3.5 QUINCKE PK (NEEDLE) ×3 IMPLANT
NS IRRIG 1000ML POUR BTL (IV SOLUTION) ×3 IMPLANT
PACK LAMINECTOMY ORTHO (CUSTOM PROCEDURE TRAY) ×3 IMPLANT
PACK UNIVERSAL I (CUSTOM PROCEDURE TRAY) ×3 IMPLANT
PAD ARMBOARD 7.5X6 YLW CONV (MISCELLANEOUS) ×12 IMPLANT
PEEK SPACER INTERPLAT 35X14X8 (Peek) ×3 IMPLANT
PLATE SPINAL INTERPLAT 39X14X8 (Plate) ×1 IMPLANT
SCREW BONE RESCUE (Screw) ×3 IMPLANT
SCREW BONE STANDARD (Screw) ×6 IMPLANT
SCREW BONE STD (Screw) ×2 IMPLANT
SCREW SPINAL STD (Orthopedic Implant) ×3 IMPLANT
SPONGE INTESTINAL PEANUT (DISPOSABLE) ×15 IMPLANT
SPONGE LAP 18X18 X RAY DECT (DISPOSABLE) IMPLANT
SPONGE LAP 4X18 X RAY DECT (DISPOSABLE) IMPLANT
SPONGE SURGIFOAM ABS GEL 100 (HEMOSTASIS) IMPLANT
STAPLER VISISTAT 35W (STAPLE) IMPLANT
STRIP CLOSURE SKIN 1/2X4 (GAUZE/BANDAGES/DRESSINGS) ×2 IMPLANT
SURGIFLO W/THROMBIN 8M KIT (HEMOSTASIS) ×3 IMPLANT
SUT BONE WAX W31G (SUTURE) ×3 IMPLANT
SUT MNCRL AB 4-0 PS2 18 (SUTURE) ×3 IMPLANT
SUT MON AB 3-0 SH 27 (SUTURE) ×2
SUT MON AB 3-0 SH27 (SUTURE) ×1 IMPLANT
SUT PDS AB 1 CTX 36 (SUTURE) ×6 IMPLANT
SUT PROLENE 4 0 RB 1 (SUTURE) ×8
SUT PROLENE 4-0 RB1 .5 CRCL 36 (SUTURE) ×4 IMPLANT
SUT PROLENE 5 0 C 1 24 (SUTURE) ×3 IMPLANT
SUT PROLENE 5 0 CC1 (SUTURE) IMPLANT
SUT PROLENE 6 0 C 1 30 (SUTURE) ×3 IMPLANT
SUT PROLENE 6 0 CC (SUTURE) IMPLANT
SUT SILK 0 TIES 10X30 (SUTURE) ×3 IMPLANT
SUT SILK 2 0 TIES 10X30 (SUTURE) ×6 IMPLANT
SUT SILK 2 0SH CR/8 30 (SUTURE) IMPLANT
SUT SILK 3 0 TIES 10X30 (SUTURE) ×6 IMPLANT
SUT SILK 3 0SH CR/8 30 (SUTURE) IMPLANT
SUT VIC AB 0 CT1 27 (SUTURE) ×2
SUT VIC AB 0 CT1 27XBRD ANBCTR (SUTURE) ×1 IMPLANT
SUT VIC AB 1 CT1 27 (SUTURE) ×4
SUT VIC AB 1 CT1 27XBRD ANBCTR (SUTURE) ×2 IMPLANT
SUT VIC AB 2-0 CT1 18 (SUTURE) ×3 IMPLANT
SUT VIC AB 2-0 CT1 36 (SUTURE) IMPLANT
SUT VIC AB 3-0 SH 27 (SUTURE)
SUT VIC AB 3-0 SH 27X BRD (SUTURE) IMPLANT
SYR BULB IRRIGATION 50ML (SYRINGE) ×3 IMPLANT
TOWEL OR 17X24 6PK STRL BLUE (TOWEL DISPOSABLE) ×3 IMPLANT
TOWEL OR 17X26 10 PK STRL BLUE (TOWEL DISPOSABLE) ×3 IMPLANT
TRAP SPECIMEN MUCOUS 40CC (MISCELLANEOUS) ×3 IMPLANT
TRAY FOLEY CATH 16FR SILVER (SET/KITS/TRAYS/PACK) ×3 IMPLANT
WATER STERILE IRR 1000ML POUR (IV SOLUTION) IMPLANT

## 2016-07-19 NOTE — OR Nursing (Signed)
Dr Coral CeoGalaronie reported no instruments noted only fusion instrumentation seen on X-Ray.

## 2016-07-19 NOTE — H&P (Signed)
History of Present Illness  The patient is a 61 year old female who presents for a follow-up for H & P. The patient is scheduled for a ALIF L5-S1 to be performed by Dr. Debria Garret D. Shon Baton, MD at [facility] on 07-19-16 . Please see the hospital record for complete dictated history and physical. Pt has hx of COPD. She quit smoking 3 years ago.  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 Cancer  Maternal Grandfather, Maternal Grandmother, Mother, Paternal Grandmother, Sister. Cerebrovascular Accident  Brother, Father, Maternal Grandfather, Maternal Grandmother, Sister. Chronic Obstructive Lung Disease  Brother, Father, Maternal Grandfather, Maternal Grandmother, Paternal Grandmother, Sister. Congestive Heart Failure  Brother, Maternal Grandfather, Maternal Grandmother, Paternal Grandmother, Sister. Depression  Brother, Maternal Grandfather, Paternal Grandfather, Paternal Grandmother, Sister. child Diabetes Mellitus  Brother, Maternal Grandfather, Maternal Grandmother, Paternal Grandfather, Paternal Grandmother, Sister. child Drug / Alcohol Addiction  Brother, Paternal Grandfather, Paternal Grandmother, Sister. First Degree Relatives  reported Heart Disease  Maternal Grandfather, Maternal Grandmother, Sister. Hypertension  Brother, Maternal Grandfather, Maternal Grandmother, Sister. child Kidney disease  Brother, Maternal Grandfather, Paternal Grandfather, Paternal Grandmother, Sister. Liver Disease, Chronic  Brother, Paternal Grandfather, Paternal Grandmother, Sister. Osteoarthritis  Brother, Maternal Grandfather, Maternal Grandmother, Paternal  Grandfather, Paternal Grandmother, Sister. Osteoporosis  Brother, Maternal Grandfather, Maternal Grandmother, Paternal Grandmother, Sister. Rheumatoid Arthritis  Father, Maternal Grandfather, Maternal Grandmother, Mother, Paternal Grandfather, Paternal Grandmother, Sister. child Severe allergy  Brother, Paternal Grandfather, Paternal Grandmother, Sister. child  Social History  Tobacco / smoke exposure  01/21/2016: yes Tobacco use  Former smoker. 01/21/2016: smoke(d) 1 pack(s) per day Children  3 Current drinker  01/21/2016: Currently drinks beer 15 or more times per week Current work status  working full time Exercise  Exercises rarely; does other Living situation  live with partner Marital status  widowed No history of drug/alcohol rehab  Not under pain contract  Number of flights of stairs before winded  2-3  Medication History  Tylenol (325MG  Tablet, Oral) Active. (prn) CeleBREX (200MG  Capsule, Oral) Active. (qd) Benadryl (25MG  Capsule, Oral) Active. (at night for sleep) Medications Reconciled  Past Surgical History Gallbladder Surgery  open  Other Problems  Chronic Obstructive Lung Disease   Vitals  07/11/2016 2:09 PM Weight: 137 lb Height: 62in Body Surface Area: 1.63 m Body Mass Index: 25.06 kg/m  Temp.: 98.56F(Oral)  BP: 138/81 (Sitting, Left Arm, Standard)  General General Appearance-Not in acute distress. Orientation-Oriented X3. Build & Nutrition-Well nourished and Well developed.  Integumentary General Characteristics Surgical Scars - no surgical scar evidence of previous lumbar surgery. Lumbar Spine-Skin examination of the lumbar spine is without deformity, skin lesions, lacerations or abrasions.  Chest and Lung Exam Auscultation Breath sounds - Normal and Clear.  Cardiovascular Auscultation Rhythm - Regular rate and rhythm.  Abdomen Palpation/Percussion Palpation and Percussion of the abdomen reveal - Soft,  Non Tender and No Rebound tenderness.  Peripheral Vascular Lower Extremity Palpation - Posterior tibial pulse - Bilateral - 2+. Dorsalis pedis pulse - Bilateral - 2+.  Neurologic Sensation Lower Extremity - Bilateral - sensation is diminished in the lower extremity. Reflexes Patellar Reflex - Bilateral - 2+. Achilles Reflex - Bilateral - 2+. Clonus - Bilateral - clonus not present. Hoffman's Sign - Bilateral - Hoffman's sign not present. Testing  Seated Straight Leg Raise - Bilateral - Seated straight leg raise negative.  Musculoskeletal Spine/Ribs/Pelvis  Lumbosacral Spine: Inspection and Palpation - Tenderness - left lumbar paraspinals tender to palpation, right lumbar paraspinals tender to palpation, left buttock is tender to palpation and right buttock is tender to palpation. Strength and Tone: Strength - Hip Flexion - Bilateral - 5/5. Knee Extension - Bilateral - 5/5. Knee Flexion - Bilateral - 5/5. Ankle Dorsiflexion - Bilateral - 5/5. Ankle Plantarflexion - Bilateral - 5/5. Heel walk - Bilateral - able to heel walk without difficulty. Toe Walk - Bilateral - able to walk on toes without difficulty. Heel-Toe Walk - Bilateral - able to heel-toe walk without difficulty. ROM - Flexion - moderately decreased range of motion and painful. Extension - moderately decreased range of motion and painful. Left Lateral Bending - moderately decreased range of motion and painful. Right Lateral Bending - moderately decreased range of motion and painful. Right Rotation - moderately decreased range of motion and painful. Left Rotation - moderately decreased range of motion and painful. Pain - neither flexion or extension is more painful than the other. Lumbosacral Spine - Waddell's Signs - no Waddell's signs present. Lower Extremity Range of Motion - No true hip, knee or ankle pain with range of motion. Gait and Station - [employer] Devices - no assistive devices.  RADIOGRAPHS Patient's MRI from October of  2017 was significant for degenerative disk disease at L5-S1, foraminal narrowing and lateral recess stenosis. Facet arthrosis as well. No frank disk herniation with no high-grade neural compression.   Assessment & Plan Goal Of Surgery: Discussed that goal of surgery is to reduce pain and improve function and quality of life. Patient is aware that despite all appropriate treatment that there pain and function could be the same, worse, or different.  Anterior lumbar fusion: Risks of surgery include, but are not limited to: Death, stroke, paralysis, nerve root damage/injury, bleeding, blood clots, loss of bowel/bladder control, sexual dysfunction, retrograde ejaculation, hardware failure, or mal-position, spinal fluid leak, adjacent segment disease, non-union, need for further surgery, ongoing or worse pain, injury to bladder, bowel and abdominal contents, infection. Need for supplemental posterior surgery including decompression and need for screw fixation.  At this point, unfortunately over the last 16 months her pain has continued to deteriorate despite physical therapy, activity modification, narcotic and non-narcotic medications, observation and injection therapy. Her quality of life and pain have progressively deteriorated. At this point, she would like to proceed with surgery, which I think is reasonable. She has had an appropriate trial of conservative care, which she has failed. The plan would be to do an anterior lumbar interbody fusion. We reviewed the risks which include infection, bleeding, nerve damage, death, stroke and paralysis, blood clots, major bleeding, ongoing or worse pain, nonunion, need for posterior supplemental fusion. We have gone over the benefits which would be a reduction in pain, not elimination, but reduction in pain, improved quality of life and hopefully the ability to return to gainful full-time employment. It would be approximately 9 to 12 months before she would be at  MMI. My hope would be to increase her activities at around three month and return to a light duty position with a slow gradual progression.

## 2016-07-19 NOTE — Anesthesia Procedure Notes (Signed)
Procedure Name: Intubation Date/Time: 07/19/2016 8:44 AM Performed by: Julieta Bellini Pre-anesthesia Checklist: Patient identified, Emergency Drugs available, Suction available, Patient being monitored and Timeout performed Patient Re-evaluated:Patient Re-evaluated prior to inductionOxygen Delivery Method: Circle system utilized Preoxygenation: Pre-oxygenation with 100% oxygen Intubation Type: IV induction Ventilation: Mask ventilation without difficulty Laryngoscope Size: Mac and 3 Grade View: Grade I Tube type: Oral Number of attempts: 1 Airway Equipment and Method: Stylet Placement Confirmation: ETT inserted through vocal cords under direct vision,  positive ETCO2 and breath sounds checked- equal and bilateral Secured at: 21 cm Tube secured with: Tape Dental Injury: Teeth and Oropharynx as per pre-operative assessment

## 2016-07-19 NOTE — Progress Notes (Signed)
New Admission Note:  Arrival Method: Bed Mental Orientation: A & O x 4 Telemetry: n/a Assessment: Completed Skin: IV: L Ac &  R wrist saline locked  Pain:6/10 & medicated Tubes: n/a Safety Measures: Safety Fall discussed Admission: Completed 5c18: Patient has been orientated to the room, unit and the staff. Family:visiting   Orders have been reviewed and implemented. Will continue to monitor the patient. Call light has been placed within reach and bed alarm has been activated.   Lawernce IonYari Missey Hasley ,RN

## 2016-07-19 NOTE — Anesthesia Postprocedure Evaluation (Signed)
Anesthesia Post Note  Patient: Catherine Lang  Procedure(s) Performed: Procedure(s) (LRB): ANTERIOR LUMBAR FUSION L5-S1 (N/A) ABDOMINAL EXPOSURE (N/A)  Patient location during evaluation: PACU Anesthesia Type: General Level of consciousness: awake, awake and alert and oriented Pain management: pain level controlled Vital Signs Assessment: post-procedure vital signs reviewed and stable Respiratory status: spontaneous breathing and nonlabored ventilation Anesthetic complications: no       Last Vitals:  Vitals:   07/19/16 1245 07/19/16 1308  BP: 118/75   Pulse: 82 90  Resp: 14 16  Temp: 36.6 C 36.8 C    Last Pain:  Vitals:   07/19/16 1713  TempSrc:   PainSc: 5                  Antone Summons COKER

## 2016-07-19 NOTE — Op Note (Signed)
    OPERATIVE REPORT  DATE OF SURGERY: 07/19/2016  PATIENT: Catherine Lang, 61 y.o. female MRN: [id number]  DOB: [birth year]  PRE-OPERATIVE DIAGNOSIS: Degenerative disc disease  POST-OPERATIVE DIAGNOSIS:  Same  PROCEDURE: Anterior exposure for L5-S1 disc surgery  SURGEON:  Gretta Beganodd Lelania Bia, M.D.  Co-surgeon for the exposure Dr. Shon BatonBrooks  PHYSICIAN ASSISTANT: Anette Riedelarmen Mayo  ANESTHESIA:  Gen.  EBL: 100 ml  Total I/O In: 1420 [P.O.:120; I.V.:1300] Out: 500 [Urine:400; Blood:100]  BLOOD ADMINISTERED: None  DRAINS: None  SPECIMEN: None  COUNTS CORRECT:  YES  PLAN OF CARE: Disc fusion will be dictated as a separate note   PATIENT DISPOSITION:  PACU - hemodynamically stable  PROCEDURE DETAILS: Patient was taken to the operative placed supine position where crosstable lateral C-arm showed the level of the L5-S1 disc. This was marked on the skin. The abdomen was prepped and draped in usual sterile fashion. Incision was made from the level of the midline to the left and carried down through the subcutaneous fat with electrocautery. The anterior rectus sheath was opened with electrocautery in line with skin incision and the rectus muscle was mobilized circumferentially. The retroperitoneal space was entered laterally and the intraperitoneal contents was rotated to the right. The left ureter was identified and was mobilized to the right as well. The L5-S1 disc was exposed. The iliac arteries and veins were mobilized middle sacral vessels were clipped with ligaclips and divided. Blunt dissection with a Kitner dissector was used to give adequate exposure for disc surgery. The Thompson retractor was brought onto the field. The reverse lip 100 Blazer position to the right and left of the disc. Malleable 140 was used to provide superior exposure. Needle was placed in the L5-S1 disc C-arm was brought back onto the field to confirm that this was indeed the L5-S1 disc. Remaining procedure will be dictated as a  separate note with Dr. Jethro BastosBrooks   Capri Raben F. Zyree Traynham, M.D., [facility]FACS 07/19/2016 4:05 PM

## 2016-07-19 NOTE — Anesthesia Preprocedure Evaluation (Addendum)
Anesthesia Evaluation  Patient identified by MRN, date of birth, ID band Patient awake    Reviewed: Allergy & Precautions, NPO status , Patient's Chart, lab work & pertinent test results  Airway Mallampati: II  TM Distance: >3 FB Neck ROM: Full    Dental  (+) Edentulous Upper, Edentulous Lower   Pulmonary former smoker,    breath sounds clear to auscultation       Cardiovascular  Rhythm:Regular Rate:Normal     Neuro/Psych    GI/Hepatic   Endo/Other    Renal/GU      Musculoskeletal   Abdominal   Peds  Hematology   Anesthesia Other Findings   Reproductive/Obstetrics                            Anesthesia Physical Anesthesia Plan  ASA: II  Anesthesia Plan: General   Post-op Pain Management:    Induction: Intravenous  Airway Management Planned: Oral ETT  Additional Equipment: Arterial line  Intra-op Plan:   Post-operative Plan: Extubation in OR  Informed Consent: I have reviewed the patients History and Physical, chart, labs and discussed the procedure including the risks, benefits and alternatives for the proposed anesthesia with the patient or authorized representative who has indicated his/her understanding and acceptance.     Plan Discussed with: Anesthesiologist  Anesthesia Plan Comments: (H/o post-op N/V Copd  Plan GA with oral ETT  )       Anesthesia Quick Evaluation

## 2016-07-19 NOTE — Brief Op Note (Signed)
07/19/2016  11:15 AM  PATIENT:  Catherine Lang  61 y.o. female  PRE-OPERATIVE DIAGNOSIS:  Degenerative disc Disease L5-S1  POST-OPERATIVE DIAGNOSIS:  Degenerative disc Disease L5-S1  PROCEDURE:  Procedure(s): ANTERIOR LUMBAR FUSION L5-S1 (N/A) ABDOMINAL EXPOSURE (N/A)  SURGEON:  Surgeon(s) and Role: Panel 1:    * Venita Lickahari Kuper Rennels, MD - Primary  Panel 2:    * Larina Earthlyodd F Early, MD - Primary  PHYSICIAN ASSISTANT:   ASSISTANTS: Carmen Mayo   ANESTHESIA:   general  EBL:  Total I/O In: -  Out: 225 [Urine:125; Blood:100]  BLOOD ADMINISTERED:none  DRAINS: none   LOCAL MEDICATIONS USED:  NONE  SPECIMEN:  No Specimen  DISPOSITION OF SPECIMEN:  N/A  COUNTS:  YES  TOURNIQUET:  * No tourniquets in log *  DICTATION: .Other Dictation: Dictation Number [id number]  PLAN OF CARE: Admit to inpatient   PATIENT DISPOSITION:  PACU - hemodynamically stable.

## 2016-07-19 NOTE — Progress Notes (Signed)
*  PRELIMINARY RESULTS* Vascular Ultrasound Bilateral lower extremity venous duplex has been completed.  Preliminary findings: No evidence of deep vein thrombosis or baker's cysts bilaterally.   Catherine FischerCharlotte C Leeland Lang 07/19/2016, 3:21 PM

## 2016-07-19 NOTE — Op Note (Signed)
NAMElvera Maria:  Lang, Catherine Lang                 ACCOUNT NO.:  [id number]  MEDICAL RECORD NO.:  [id number]  LOCATION:  PERIO                        FACILITY:  [facility]  PHYSICIAN:  Catherine Lang, M.D. DATE OF BIRTH:  08-Jun-1955  DATE OF PROCEDURE:  07/19/2016 DATE OF DISCHARGE:                              OPERATIVE REPORT   PREOPERATIVE DIAGNOSES:  Degenerative lumbar disk disease, L5-S1.  POSTOPERATIVE DIAGNOSIS:  Degenerative lumbar disk disease, L5-S1.  OPERATIVE PROCEDURE:  Anterior lumbar interbody fusion approach.  SURGEON:  Catherine Lang, M.D.  FIRST ASSISTANT:  Catherine Lang (Catherine Lang)Catherine Lang.  IMPLANT SYSTEM USED:  The RSB PEEK interbody plate with titanium 0 profile plate.  This was a size 14, 8-degree lordotic large cage packed with ViviGen and secured with two 20 mm screws and one 25 mm screw.  COMPLICATIONS:  None.  CONDITION:  Stable.  HISTORY:  This is a very pleasant 61 year old woman, who has been having progressive debilitating back pain.  Despite long-term appropriate conservative management, her quality of life continued to suffer.  As a result, we elected to move forward with surgery.  All appropriate risks, benefits, and alternatives were discussed with the patient and consent was obtained.  OPERATIVE NOTE:  The patient was brought to the operating room and placed supine on the operating table.  After successful induction of general anesthesia and endotracheal intubation, TEDs, SCDs, and a Foley were inserted.  The abdomen was then prepped and draped in a standard fashion and the incision site was marked out.  Time-out was taken confirming patient, procedure, and all other pertinent important data. Once this was complete, Dr. Arbie Lang scrubbed in and performed a standard anterior retroperitoneal approach to the lumbar spine.  Please refer to his dictation for specifics on the approach.  Once the [facility] self-retaining retractors were placed and the L5-S1 disk exposed, we placed a  needle and took x-ray to confirm the level. Once this was done, I then scrubbed in and Dr. Arbie Lang scrubbed out of the case.  Myself and Catherine Lang, [facility] Lang, continued on with surgery.  An annulotomy was created with a 10-blade scalpel and then using a variety of pituitary rongeurs, curettes, and Kerrison rongeurs, I resected all of the disk material.  I distracted the intervertebral space and continued to work posteriorly.  Once I was at the posterior annulus, I used a fine curved curette to release the posterior annulus from the back of the vertebral body of L5 and S1.  I then used Catherine 2 mm Kerrison rongeur to resect the posterior osteophytes from the L5-S1 vertebral bodies.  I then went out toward the uncovertebral joint ensuring had an adequate decompression.  Once the diskectomy was complete, I then trialed starting at a size 12 and proceeded up to the size 14 large 8-degree lordotic spacer trial. This provided excellent indirect foraminal decompression.  There was no fish mouthing.  It was properly positioned, had excellent contact at both endplates.  At this point, I elected to use this implant.  The size 14 RSB PEEK cage was obtained and packed with ViviGen.  I removed the trial, irrigated and cleaned out the remainder of the posterior disk space.  The intervertebral cage was then malleted to the appropriate depth along with the anterior 0 profile plate.  Great care was taken to protect the iliac veins during this procedure.  Once we had the 0 profile plate countersunk, x-rays were taken, which demonstrated satisfactory position of the cage and the plate.  An awl was used to create the cortical defect to place the locking screws.  Two were placed into the body of L5, one into the body of S1.  All screws had excellent purchase.  The locking plate was then secured down and torqued off.  I copiously irrigated with normal saline and obtained hemostasis using bipolar electrocautery and  FloSeal.  I then sequentially removed the retractors ensuring that there was no bleeding.  Once all the retractors were removed, I then closed the fascia of the rectus with two #1 PDS running sutures.  They were tied at the ends and in the center.  I then closed sequentially with 2-0 Vicryl sutures and 3-0 Monocryl.  Steri- Strips and dry dressing were applied.  Final AP and lateral fluoro views were satisfactory.  The hardware and graft were in good position, and a final AP plane x-ray was taken to ensure there were no retained surgical instruments in the wound.  Once this was completed, the patient was ultimately extubated and transferred to the PACU without incident.  At the end of the case, all needle and sponge counts were correct.  There were no adverse intraoperative events.     Catherine Lang D. Shon Baton, M.D.     DDB/MEDQ  D:  07/19/2016  T:  07/19/2016  Job:  [id number]

## 2016-07-19 NOTE — Transfer of Care (Signed)
Immediate Anesthesia Transfer of Care Note  Patient: Catherine Lang  Procedure(s) Performed: Procedure(s): ANTERIOR LUMBAR FUSION L5-S1 (N/A) ABDOMINAL EXPOSURE (N/A)  Patient Location: PACU  Anesthesia Type:General  Level of Consciousness: awake, alert  and patient cooperative  Airway & Oxygen Therapy: Patient Spontanous Breathing  Post-op Assessment: Report given to RN, Post -op Vital signs reviewed and stable and Patient moving all extremities X 4  Post vital signs: Reviewed and stable  Last Vitals:  Vitals:   07/19/16 0646  BP: 133/64  Pulse: 86  Resp: 18  Temp: 37.1 C    Last Pain:  Vitals:   07/19/16 0652  TempSrc:   PainSc: 4          Complications: No apparent anesthesia complications

## 2016-07-20 ENCOUNTER — Inpatient Hospital Stay (HOSPITAL_COMMUNITY): Payer: Worker's Compensation

## 2016-07-20 NOTE — Progress Notes (Signed)
Discussed with pt and pt daughter how to properly administer Lovenox. Video on teaching of lovenox was shown to pt and pt daughter. Both verbalized understanding of information of lovenox therapy 1 injection daily for 10 days.

## 2016-07-20 NOTE — Care Management Note (Signed)
Case Management Note  Patient Details  Name: Catherine Lang MRN: [id number] Date of Birth: 03-22-1956  Subjective/Objective:  Patient s/p lumbar fusion. She is from home with her spouse. Pt states she has all DME needed: walker, cane, 3 in 1. Pt is under [employer] and her CM is Boneta LucksJenny: [phone].                  Action/Plan: No f/u per OT. Awaiting PT recommendations. CM following for d/c needs.   Expected Discharge Date:  07/21/16               Expected Discharge Plan:  Home/Self Care  In-House Referral:     Discharge planning Services     Post Acute Care Choice:    Choice offered to:     DME Arranged:    DME Agency:     HH Arranged:    HH Agency:     Status of Service:  In process, will continue to follow  If discussed at Long Length of Stay Meetings, dates discussed:    Additional Comments:  Kermit BaloKelli F Alisan Dokes, RN 07/20/2016, 12:17 PM

## 2016-07-20 NOTE — Progress Notes (Signed)
    Subjective: 1 Day Post-Op Procedure(s) (LRB): ANTERIOR LUMBAR FUSION L5-S1 (N/A) ABDOMINAL EXPOSURE (N/A) Patient reports pain as 3 on 0-10 scale.   Denies CP or SOB.  Voiding without difficulty. Positive flatus. Pt reports leg pain Objective: Vital signs in last 24 hours: Temp:  [97.8 F (36.6 C)-98.3 F (36.8 C)] 98.2 F (36.8 C) (02/15 0500) Pulse Rate:  [82-104] 92 (02/15 0500) Resp:  [10-20] 18 (02/15 0500) BP: (97-128)/(43-85) 105/51 (02/15 0500) SpO2:  [88 %-98 %] 94 % (02/15 0500) Weight:  [58.5 kg (129 lb)] 58.5 kg (129 lb) (02/14 1308)  Intake/Output from previous day: 02/14 0701 - 02/15 0700 In: 2340 [P.O.:840; I.V.:1300; IV Piggyback:200] Out: 2800 [Urine:2700; Blood:100] Intake/Output this shift: No intake/output data recorded.  Labs:  Recent Labs  07/19/16 1357  HGB 13.8    Recent Labs  07/19/16 1357  WBC 13.4*  RBC 4.17  HCT 41.3  PLT 353    Recent Labs  07/19/16 1357  CREATININE 0.76   No results for input(s): LABPT, INR in the last 72 hours.  Physical Exam: Neurologically intact ABD soft Sensation intact distally Dorsiflexion/Plantar flexion intact Incision: scant drainage Compartment soft  Assessment/Plan: 1 Day Post-Op Procedure(s) (LRB): ANTERIOR LUMBAR FUSION L5-S1 (N/A) ABDOMINAL EXPOSURE (N/A) Advance diet Up with therapy  Mayo, Baxter Kailarmen Christina for Dr. Venita Lickahari Brooks [facility] [phone] 07/20/2016, 7:42 AM       Subjective: 1 Day Post-Op Procedure(s) (LRB): ANTERIOR LUMBAR FUSION L5-S1 (N/A) ABDOMINAL EXPOSURE (N/A) Patient reports pain as 3 on 0-10 scale.   Denies CP or SOB.  Voiding without difficulty. Positive flatus. Objective: Vital signs in last 24 hours: Temp:  [97.8 F (36.6 C)-98.3 F (36.8 C)] 98.2 F (36.8 C) (02/15 0500) Pulse Rate:  [82-104] 92 (02/15 0500) Resp:  [10-20] 18 (02/15 0500) BP: (97-128)/(43-85) 105/51 (02/15 0500) SpO2:  [88 %-98 %] 94 % (02/15  0500) Weight:  [58.5 kg (129 lb)] 58.5 kg (129 lb) (02/14 1308)  Intake/Output from previous day: 02/14 0701 - 02/15 0700 In: 2340 [P.O.:840; I.V.:1300; IV Piggyback:200] Out: 2800 [Urine:2700; Blood:100] Intake/Output this shift: No intake/output data recorded.  Labs:  Recent Labs  07/19/16 1357  HGB 13.8    Recent Labs  07/19/16 1357  WBC 13.4*  RBC 4.17  HCT 41.3  PLT 353    Recent Labs  07/19/16 1357  CREATININE 0.76   No results for input(s): LABPT, INR in the last 72 hours.  Physical Exam: Neurologically intact ABD soft Sensation intact distally Dorsiflexion/Plantar flexion intact Incision: scant drainage Compartment soft  Assessment/Plan: 1 Day Post-Op Procedure(s) (LRB): ANTERIOR LUMBAR FUSION L5-S1 (N/A) ABDOMINAL EXPOSURE (N/A) Advance diet Up with therapy Consider DC home today if cleared by PT Mayo, Baxter Kailarmen Christina for Dr. Venita Lickahari Brooks [facility] [phone] 07/20/2016, 7:42 AM    Patient ID: Catherine Lang, female   DOB: 12/06/1955, 61 y.o.   MRN: [id number]

## 2016-07-20 NOTE — Progress Notes (Signed)
Patient ID: Catherine Lang, female   DOB: Sep 08, 1955, 61 y.o.   MRN: [id number] Looks great. Comfortable. Minimal discomfort. Sitting up in chair. No nausea Back brace in place. 2+ dorsalis pedis pulses bilaterally. Stable postop day 1. Will not follow actively. Please call if we can assist.

## 2016-07-20 NOTE — Evaluation (Signed)
Occupational Therapy Evaluation and Discharge Patient Details Name: Catherine Lang MRN: [id number] DOB: Nov 05, 1955 Today's Date: 07/20/2016    History of Present Illness Anterior lumbar interbody fusion L5-S1   Clinical Impression   This 61 yo female admitted and underwent above presents to acute OT with all education completed with pt and dtr, no further OT needs we will sign off.    Follow Up Recommendations  No OT follow up    Equipment Recommendations  None recommended by OT       Precautions / Restrictions Precautions Precautions: Back Precaution Booklet Issued: Yes (comment) Required Braces or Orthoses: Spinal Brace Spinal Brace: Applied in sitting position;Applied in standing position Restrictions Weight Bearing Restrictions: No      Mobility Bed Mobility               General bed mobility comments: Pt up in recliner upon arrival--I did make her aware that when she gets back into and out of bed to practice on the right side with HOB flat and no rail. (this is the way she would do it at home)  Transfers Overall transfer level: Needs assistance Equipment used: None Transfers: Sit to/from Stand Sit to Stand: Min guard                   ADL Overall ADL's : Needs assistance/impaired Eating/Feeding: Independent;Sitting   Grooming: Set up;Standing   Upper Body Bathing: Set up;Sitting   Lower Body Bathing: Set up;Supervison/ safety;Sit to/from stand Lower Body Bathing Details (indicate cue type and reason): Pt able to cross one leg over other at knee to get to feet while seated Upper Body Dressing : Set up;Sitting   Lower Body Dressing: Supervision/safety;Set up;Sit to/from stand Lower Body Dressing Details (indicate cue type and reason): Pt able to cross one leg over other at knee to get to feet while seated Toilet Transfer: Supervision/safety;Ambulation;RW;BSC (over toilet)     Toileting - Clothing Manipulation Details (indicate cue type and  reason): Discussed this with pt and dtr to use wet wipes for back peri care   Tub/Shower Transfer Details (indicate cue type and reason): Discussed transfer technique with pt and dtr for side stepping into tub                   Pertinent Vitals/Pain Pain Assessment: 0-10 Pain Score: 4  Pain Location: incisional pain Pain Descriptors / Indicators: Operative site guarding Pain Intervention(s): Monitored during session;Repositioned (pt reported that RN was bringing her pain meds)     Hand Dominance Right   Extremity/Trunk Assessment Upper Extremity Assessment Upper Extremity Assessment: Overall WFL for tasks assessed   Lower Extremity Assessment Lower Extremity Assessment: Defer to PT evaluation       Communication Communication Communication: No difficulties   Cognition Arousal/Alertness: Awake/alert Behavior During Therapy: WFL for tasks assessed/performed Overall Cognitive Status: Within Functional Limits for tasks assessed                                Home Living Family/patient expects to be discharged to:: Private residence Living Arrangements: Children Available Help at Discharge: Family Type of Home: House Home Access: Stairs to enter Secretary/administrator of Steps: 1 Entrance Stairs-Rails: None Home Layout: One level     Bathroom Shower/Tub: Tub/shower unit;Curtain Shower/tub characteristics: Engineer, building services: Standard     Home Equipment: Bedside commode;Hand held shower head  Prior Functioning/Environment Level of Independence: Independent                                        End of Session Equipment Utilized During Treatment: Back brace  Activity Tolerance: Patient tolerated treatment well Patient left: in chair;with call bell/phone within reach;with family/visitor present   Time: 0920-0940 OT Time Calculation (min): 20 min Charges:  OT General Charges $OT Visit: 1 Procedure OT  Evaluation $OT Eval Moderate Complexity: 1 Procedure  Evette GeorgesLeonard, Kayliana Codd Eva [phone] 07/20/2016, 9:48 AM

## 2016-07-20 NOTE — Progress Notes (Signed)
Patient ID: Catherine Lang, female   DOB: [birth year], 61 y.o.   MRN: [id number]  Reviewed pts xrays - Hardware well placed Reviewed Dopplers - clear, no DVT noted Pt will go home on Lovenox Reviewed labs - WBC elevated - recent surgical intervention Noted Low BP - pt and daughter reports it is usually low She denied dizziness, lightheaded with ambulation OT signed off   Bellwoodarmen Mayo, [facility]

## 2016-07-20 NOTE — Care Management Note (Signed)
Case Management Note  Patient Details  Name: Elvera MariaZana Portillo MRN: [id number] Date of Birth: 1956-03-02  Subjective/Objective:                    Action/Plan: Pt d/cing home with self care. No f/u needs per PT/OT. Pt has insurance, PCP and transportation home. No further needs per CM.    Expected Discharge Date:  07/20/16               Expected Discharge Plan:  Home/Self Care  In-House Referral:     Discharge planning Services     Post Acute Care Choice:    Choice offered to:     DME Arranged:    DME Agency:     HH Arranged:    HH Agency:     Status of Service:  Completed, signed off  If discussed at [employer] Length of Stay Meetings, dates discussed:    Additional Comments:  Kermit BaloKelli F Adiya Selmer, RN 07/20/2016, 2:55 PM

## 2016-07-20 NOTE — Evaluation (Signed)
Physical Therapy Evaluation Patient Details Name: Catherine Lang MRN: [id number] DOB: 02/16/1956 Today's Date: 07/20/2016   History of Present Illness  Anterior lumbar interbody fusion L5-S1  Clinical Impression  Pt presents s/p anterior lumbar fusion. Pt required supervision during transfers and ambulation to cue on back precautions. Recommend d/c home due to pt staying with her daughter during her recovery. No further skilled PT recommended at this time.     Follow Up Recommendations No PT follow up    Equipment Recommendations  None recommended by PT    Recommendations for Other Services       Precautions / Restrictions Precautions Precautions: Back Required Braces or Orthoses: Spinal Brace Spinal Brace: Applied in sitting position;Applied in standing position Restrictions Weight Bearing Restrictions: No      Mobility  Bed Mobility               General bed mobility comments: Pt in recliner upon arrival  Transfers Overall transfer level: Needs assistance   Transfers: Sit to/from Stand Sit to Stand: Supervision         General transfer comment: pt required verbal cueing for correct hand placement during sit<>stand and for maintaining precautions. Demonstrated car transfer with proper technique to maintain back precautions.   Ambulation/Gait Ambulation/Gait assistance: Supervision Ambulation Distance (Feet): 200 Feet Assistive device: Rolling walker (2 wheeled) Gait Pattern/deviations: Step-through pattern;Decreased stride length     General Gait Details: required verbal cueing for maintaing back precautions and not twisting during ambulation   Stairs         General stair comments: Pt stated that she does not have steps but small ledges to navigate to enter her home. Instructed patient on proper rolling walker placement and maintaining precautions.   Wheelchair Mobility    Modified Rankin (Stroke Patients Only)       Balance Overall balance  assessment: No apparent balance deficits (not formally assessed)                                           Pertinent Vitals/Pain Pain Assessment: 0-10 Pain Score: 5  Pain Location: incisional pain Pain Intervention(s): Limited activity within patient's tolerance;Monitored during session    Home Living Family/patient expects to be discharged to:: Private residence Living Arrangements: Children Available Help at Discharge: Family Type of Home: House Home Access: Stairs to enter Entrance Stairs-Rails: None Entrance Stairs-Number of Steps: 1 (pt states that it is less of a stair and more of a small ledge ) Home Layout: One level Home Equipment: Bedside commode;Hand held shower head;Walker - 2 wheels      Prior Function Level of Independence: Independent               Hand Dominance   Dominant Hand: Right    Extremity/Trunk Assessment   Upper Extremity Assessment Upper Extremity Assessment: Defer to OT evaluation    Lower Extremity Assessment Lower Extremity Assessment: Overall WFL for tasks assessed       Communication   Communication: No difficulties  Cognition Arousal/Alertness: Awake/alert Behavior During Therapy: WFL for tasks assessed/performed Overall Cognitive Status: Within Functional Limits for tasks assessed                      General Comments      Exercises     Assessment/Plan    PT Assessment Patent does not need any further PT  services  PT Problem List            PT Treatment Interventions      PT Goals (Current goals can be found in the Care Plan section)  Acute Rehab PT Goals Patient Stated Goal: Pt is ready to get back home and become independent again PT Goal Formulation: With patient Time For Goal Achievement: 07/27/16 Potential to Achieve Goals: Good    Frequency     Barriers to discharge        Co-evaluation               End of Session Equipment Utilized During Treatment: Gait  belt Activity Tolerance: Patient tolerated treatment well Patient left: in chair;with call bell/phone within reach           Time: 1610-9604 PT Time Calculation (min) (ACUTE ONLY): 19 min   Charges:   PT Evaluation $PT Eval Moderate Complexity: 1 Procedure     PT G CodesMilana Kidney, SPT 07/20/2016

## 2016-07-20 NOTE — Progress Notes (Signed)
Discussed discharge information with pt and pt daughter. Both verbalized understanding of discharge information. Pt will be taken to discharge via wheelchair. Daughter will be driving pt home.

## 2016-07-21 ENCOUNTER — Encounter (HOSPITAL_COMMUNITY): Payer: Self-pay | Admitting: Orthopedic Surgery

## 2016-07-27 ENCOUNTER — Other Ambulatory Visit (HOSPITAL_COMMUNITY): Payer: Self-pay | Admitting: Orthopedic Surgery

## 2016-07-27 ENCOUNTER — Ambulatory Visit (HOSPITAL_COMMUNITY)
Admission: RE | Admit: 2016-07-27 | Discharge: 2016-07-27 | Disposition: A | Discharge status: Home / Self Care | Payer: Worker's Compensation | Source: Ambulatory Visit | Attending: Internal Medicine | Admitting: Internal Medicine

## 2016-07-27 DIAGNOSIS — Z981 Arthrodesis status: Secondary | ICD-10-CM | POA: Insufficient documentation

## 2016-07-27 DIAGNOSIS — M7989 Other specified soft tissue disorders: Secondary | ICD-10-CM | POA: Diagnosis not present

## 2016-07-27 DIAGNOSIS — M79609 Pain in unspecified limb: Secondary | ICD-10-CM | POA: Insufficient documentation

## 2016-07-27 DIAGNOSIS — Z4789 Encounter for other orthopedic aftercare: Secondary | ICD-10-CM | POA: Diagnosis present

## 2016-07-27 DIAGNOSIS — M79604 Pain in right leg: Secondary | ICD-10-CM

## 2016-07-27 NOTE — Progress Notes (Signed)
**  Preliminary report by tech**  Right lower extremity venous duplex complete. There is no evidence of deep or superficial vein thrombosis involving the right lower extremity. All visualized veins appear patent and compressible. There is no evidence of a Baker's cyst on the right. Results were given to Dr. Shon BatonBrooks' nurse, Junious Dresseronnie.  07/27/16 4:51 PM Olen CordialGreg Lyan Holck RVT

## 2016-08-04 NOTE — Discharge Summary (Signed)
Physician Discharge Summary  Patient ID: Catherine Lang MRN: [id number] DOB/AGE: 1955/12/13 61 y.o.  Admit date: 07/19/2016 Discharge date: 08/04/2016  Admission Diagnoses:  Lumbar DDD  Discharge Diagnoses:  Active Problems:   Back pain   Past Medical History:  Diagnosis Date  . Arthritis   . Complication of anesthesia    yrs ago had Nausea but no problem in 10/17  . COPD (chronic obstructive pulmonary disease) (HCC)    when cough hard throw up- 1-2 tx week  . Dyspnea    with exersion  . PONV (postoperative nausea and vomiting)     Surgeries: Procedure(s): ANTERIOR LUMBAR FUSION L5-S1 ABDOMINAL EXPOSURE on 07/19/2016   Consultants (if any): Treatment Team:  Larina Earthly, MD  Discharged Condition: Improved  Hospital Course: Makhiya Coburn is an 61 y.o. female who was admitted 07/19/2016 with a diagnosis of Lumbar DDD and went to the operating room on 07/19/2016 and underwent the above named procedures.  Post op day one pt ambulating in the hall.  Pt cleared by PT.  Pt denies nausea.  Pain controlled on oral medications.  Pt urinating w/o difficulty  She was given perioperative antibiotics:  Anti-infectives    Start     Dose/Rate Route Frequency Ordered Stop   07/19/16 1700  ceFAZolin (ANCEF) IVPB 2g/100 mL premix     2 g 200 mL/hr over 30 Minutes Intravenous Every 8 hours 07/19/16 1352 07/20/16 0048   07/19/16 1500  ceFAZolin (ANCEF) IVPB 2g/100 mL premix  Status:  Discontinued     2 g 200 mL/hr over 30 Minutes Intravenous Every 8 hours 07/19/16 1319 07/19/16 1355   07/19/16 0640  ceFAZolin (ANCEF) IVPB 2g/100 mL premix     2 g 200 mL/hr over 30 Minutes Intravenous 30 min pre-op 07/19/16 0640 07/19/16 0850    .  She was given sequential compression devices, early ambulation, and TED for DVT prophylaxis.  She benefited maximally from the hospital stay and there were no complications.    Recent vital signs:  Vitals:   07/20/16 0500 07/20/16 1012  BP: (!) 105/51 (!) 110/55   Pulse: 92 98  Resp: 18 20  Temp: 98.2 F (36.8 C) 97.9 F (36.6 C)    Recent laboratory studies:  Lab Results  Component Value Date   HGB 13.8 07/19/2016   HGB 14.2 07/12/2016   Lab Results  Component Value Date   WBC 13.4 (H) 07/19/2016   PLT 353 07/19/2016   No results found for: INR Lab Results  Component Value Date   NA 138 07/12/2016   K 3.7 07/12/2016   CL 102 07/12/2016   CO2 27 07/12/2016   BUN 5 (L) 07/12/2016   CREATININE 0.76 07/19/2016   GLUCOSE 101 (H) 07/12/2016    Discharge Medications:   Allergies as of 07/20/2016      Reactions   Tramadol Itching   Hydrocodone Itching      Medication List    STOP taking these medications   acetaminophen 500 MG tablet Commonly known as:  TYLENOL   celecoxib 200 MG capsule Commonly known as:  CELEBREX     TAKE these medications   enoxaparin 40 MG/0.4ML injection Commonly known as:  LOVENOX Inject 0.4 mLs (40 mg total) into the skin daily. 10 day supply 1 injection per day   methocarbamol 500 MG tablet Commonly known as:  ROBAXIN Take 1 tablet (500 mg total) by mouth 3 (three) times daily as needed for muscle spasms.   ondansetron 4 MG  tablet Commonly known as:  ZOFRAN Take 1 tablet (4 mg total) by mouth every 8 (eight) hours as needed for nausea or vomiting.   oxyCODONE-acetaminophen 10-325 MG tablet Commonly known as:  PERCOCET Take 1 tablet by mouth every 4 (four) hours as needed for pain.   PROBIOTIC DAILY PO Take 1 capsule by mouth daily.       Diagnostic Studies: Dg Lumbar Spine 2-3 Views  Result Date: 07/20/2016 CLINICAL DATA:  Lumbar fusion. EXAM: LUMBAR SPINE - 2-3 VIEW FINDINGS: L5-S1 anterior interbody fusion. Diffuse degenerative change. No acute bony abnormality identified. Normal alignment mineralization. Pelvic calcifications consistent phleboliths. Distended loops small large bowel consistent with adynamic ileus. IMPRESSION: 1. L5-S1 anterior interbody fusion. Diffuse osteopenia  and degenerative change. No acute bony abnormality. 2. Adynamic ileus . Electronically Signed   By: Maisie Fus  Register   On: 07/20/2016 08:29   Dg Lumbar Spine 2-3 Views  Result Date: 07/19/2016 CLINICAL DATA:  Lumbar fusion. EXAM: DG C-ARM 61-120 MIN; LUMBAR SPINE - 2-3 VIEW COMPARISON:  No recent prior. FINDINGS: Lumbar vertebra number with the lowest lumbar shaped vertebral lateral view as L5. L5-S1 anterior and interbody fusion. Hardware intact. Anatomic alignment. IMPRESSION: L5-S1 anterior interbody fusion.  Hardware intact . Electronically Signed   By: Maisie Fus  Register   On: 07/19/2016 11:25   Dg C-arm 1-60 Min  Result Date: 07/19/2016 CLINICAL DATA:  Lumbar fusion. EXAM: DG C-ARM 61-120 MIN; LUMBAR SPINE - 2-3 VIEW COMPARISON:  No recent prior. FINDINGS: Lumbar vertebra number with the lowest lumbar shaped vertebral lateral view as L5. L5-S1 anterior and interbody fusion. Hardware intact. Anatomic alignment. IMPRESSION: L5-S1 anterior interbody fusion.  Hardware intact . Electronically Signed   By: Maisie Fus  Register   On: 07/19/2016 11:25   Dg Or Local Abdomen  Result Date: 07/19/2016 CLINICAL DATA:  Abnormal instrument count. EXAM: OR LOCAL ABDOMEN COMPARISON:  None FINDINGS: One view abdomen demonstrates interbody fusion device at L5-S1. No unexpected radiopaque foreign bodies are identified. IMPRESSION: No unexpected radiopaque foreign bodies. Report called to the OR. Electronically Signed   By: Rudie Meyer M.D.   On: 07/19/2016 11:19    Disposition: 01-Home or Self Care Pt will present to clinic in 2 weeks Post op medications provided  Discharge Instructions    Incentive spirometry RT    Complete by:  As directed       Follow-up Information    BROOKS,DAHARI D, MD. Schedule an appointment as soon as possible for a visit in 2 weeks.   Specialty:  Orthopedic Surgery Why:  If symptoms worsen, For suture removal, For wound re-check Contact information: [address] [place] [address] [phone]            Signed: Kirt Boys 08/04/2016, 11:27 AM

## 2016-08-11 ENCOUNTER — Encounter (HOSPITAL_COMMUNITY): Payer: Self-pay | Admitting: Orthopedic Surgery

## 2017-09-10 ENCOUNTER — Other Ambulatory Visit: Payer: Self-pay | Admitting: Orthopedic Surgery

## 2017-09-10 DIAGNOSIS — M961 Postlaminectomy syndrome, not elsewhere classified: Secondary | ICD-10-CM

## 2017-09-20 ENCOUNTER — Ambulatory Visit
Admission: RE | Admit: 2017-09-20 | Discharge: 2017-09-20 | Disposition: A | Discharge status: Home / Self Care | Payer: Worker's Compensation | Source: Ambulatory Visit | Attending: Orthopedic Surgery | Admitting: Orthopedic Surgery

## 2017-09-20 DIAGNOSIS — M961 Postlaminectomy syndrome, not elsewhere classified: Secondary | ICD-10-CM

## 2017-09-20 MED ORDER — METHYLPREDNISOLONE ACETATE 40 MG/ML INJ SUSP (RADIOLOG: 120.0000 mg | Freq: Once | INTRAMUSCULAR | Status: DC

## 2017-11-06 NOTE — H&P (Addendum)
Patient ID: Catherine Lang MRN: [id number] DOB/AGE: 64-25-1957 64 y.o.  Admit date: (Not on file)  Admission Diagnoses:  Right SI joint Dysfunction  HPI: The patient is here today for a pre-operative History and Physical. They are scheduled for right SI joint fusion on 11-15-17 with Dr. Shon [facility]atonBrooks at  [facility].  The pt has COPD.  The pt stopped smoking 4 years ago.  Past Medical History: Past Medical History:  Diagnosis Date  . Arthritis   . Complication of anesthesia    yrs ago had Nausea but no problem in 10/17  . COPD (chronic obstructive pulmonary disease) (HCC)    when cough hard throw up- 1-2 tx week  . Dyspnea    with exersion  . PONV (postoperative nausea and vomiting)     Surgical History: Past Surgical History:  Procedure Laterality Date  . ABDOMINAL EXPOSURE N/A 07/19/2016   Procedure: ABDOMINAL EXPOSURE;  Surgeon: Larina Earthlyodd F Early, MD;  [facility];  Service: Vascular;  Laterality: N/A;  . ANTERIOR LUMBAR FUSION N/A 07/19/2016   Procedure: ANTERIOR LUMBAR FUSION L5-S1;  Surgeon: Venita Lickahari Tyquisha Sharps, MD;  Location: [facility] OR;  Service: Orthopedics;  Laterality: N/A;  . CHOLECYSTECTOMY     ?  . EAR CANALOPLASTY  80's   bleed vessel taken from hand and put in ear? improve hearing  . KNEE ARTHROSCOPY Left 03/2016   cartilage  . TUBAL LIGATION      Family History: No family history on file.  Social History: Social History   Socioeconomic History  . Marital status: Widowed    Spouse name: Not on file  . Number of children: Not on file  . Years of education: Not on file  . Highest education level: Not on file  Occupational History  . Not on file  Social Needs  . Financial resource strain: Not on file  . Food insecurity:    Worry: Not on file    Inability: Not on file  . Transportation needs:    Medical: Not on file    Non-medical: Not on file  Tobacco Use  . Smoking status: Former Smoker    Packs/day: 1.50    Years: 30.00    Pack years: 45.00   Last attempt to quit: 07/12/2013    Years since quitting: 4.3  . Smokeless tobacco: Never Used  Substance and Sexual Activity  . Alcohol use: Yes    Alcohol/week: 10.8 oz    Types: 18 Cans of beer per week  . Drug use: No  . Sexual activity: Not on file  Lifestyle  . Physical activity:    Days per week: Not on file    Minutes per session: Not on file  . Stress: Not on file  Relationships  . Social connections:    Talks on phone: Not on file    Gets together: Not on file    Attends religious service: Not on file    Active member of club or organization: Not on file    Attends meetings of clubs or organizations: Not on file    Relationship status: Not on file  . Intimate partner violence:    Fear of current or ex partner: Not on file    Emotionally abused: Not on file    Physically abused: Not on file    Forced sexual activity: Not on file  Other Topics Concern  . Not on file  Social History Narrative  . Not on file    Allergies: Tramadol and  Hydrocodone  Medications: I have reviewed the patient's current medications.  Vital Signs: No data found.  Radiology: No results found.  Labs: No results for input(s): WBC, RBC, HCT, PLT in the last 72 hours. No results for input(s): NA, K, CL, CO2, BUN, CREATININE, GLUCOSE, CALCIUM in the last 72 hours. No results for input(s): LABPT, INR in the last 72 hours.  Review of Systems: ROS  Physical Exam: There is no height or weight on file to calculate BMI.  Physical Exam  Constitutional: She is oriented to person, place, and time. She appears well-developed and well-nourished.  HENT:  Head: Normocephalic.  Cardiovascular: Normal rate and regular rhythm.  Respiratory: Effort normal and breath sounds normal.  GI: Soft. Bowel sounds are normal.  Neurological: She is alert and oriented to person, place, and time.  Skin: Skin is warm and dry.  Psychiatric: She has a normal mood and affect. Her behavior is normal. Judgment  and thought content normal.    Lumbar spine: Significant right SI joint pain with palpation and range of motion.  Positive Gleason's test.  Positive Patrick's test.  No significant hip, knee, ankle pain with isolated joint range of motion.  SI joint pain is different than her preoperative low back pain the preoperative low back pain is all but resolved and now her biggest problem is the SI joint pain. Neuro: Positive dysesthesias in the thigh.  No focal motor deficit (5 out of 5 strength in the lower extremities) negative nerve root tension signs in the lower extremity.  1+ deep tendon reflexes at the knee and the Achilles Reflexes: Babinski: Negative  Vascular: Lower extremity peripheral pulses are 2+ and symmetrical.  Compartments are soft and nontender  Assessment and Plan: Risks and benefits of surgery were discussed with the patient. These include: Infection, bleeding, death, stroke, paralysis, ongoing or worse pain, need for additional surgery, nonunion, mal-position of the screws.  Goals of surgery: Reduced (not eliminated) pain, and improved quality of life.  Anette Riedel, PAC for Venita Lick, MD [facility] [phone]  Patient continues to have significant right SI joint pain.  Her clinical exam is essentially unchanged.  Patient has tried and failed appropriate conservative management consisting of injection therapy, physiotherapy, and activity modification.  We have again gone over the procedure as well as the risks and benefits.  All of her and her daughter's questions were addressed.  We have plan for the patient spend the night but if she clears physical therapy and is doing well and she can be discharged this afternoon.

## 2017-11-09 ENCOUNTER — Inpatient Hospital Stay (HOSPITAL_COMMUNITY)
Admission: RE | Admit: 2017-11-09 | Discharge: 2017-11-09 | Disposition: A | Discharge status: Home / Self Care | Payer: Self-pay | Source: Ambulatory Visit

## 2017-11-13 NOTE — Pre-Procedure Instructions (Signed)
Elvera MariaZana Sol  11/13/2017      [facility] [place] - [id number] [place] [address] Phone: [phone] Fax: [phone]  [facility] [id number] [place]- [place], [place] - [address] [facility] [address] [facility] [address] [place] [address] Phone: [phone] Fax: [phone]    Your procedure is scheduled on June 13  [facility] Admitting at 530 A.M.  Call this number if you have problems the morning of surgery:  [phone]   Remember:  No food or drink after midnight.      Take these medicines the morning of surgery with A SIP OF WATER    Stop taking aspirin as directed by your Dr.  Stop taking BC's, Goody's, Herbal medications, Fish Oil, Aleve, Vitamins, Ibuprofen, Advil, Motrin    Do not wear jewelry, make-up or nail polish.  Do not wear lotions, powders, or perfumes, or deodorant.  Do not shave 48 hours prior to surgery.  Men may shave face and neck.  Do not bring valuables to the hospital.  [facility] is not responsible for any belongings or valuables.  Contacts, dentures or bridgework may not be worn into surgery.  Leave your suitcase in the car.  After surgery it may be brought to your room.  For patients admitted to the hospital, discharge time will be determined by your treatment team.  Patients discharged the day of surgery will not be allowed to drive home.   Special instructions:   [facility]- Preparing For Surgery  Before surgery, you can play an important role. Because skin is not sterile, your skin needs to be as free of germs as possible. You can reduce the number of germs on your skin by washing with CHG (chlorahexidine gluconate) Soap before surgery.  CHG is an antiseptic cleaner which kills germs and bonds with the skin to continue killing germs even after washing.    Oral Hygiene is also important to reduce your risk of  infection.  Remember - BRUSH YOUR TEETH THE MORNING OF SURGERY WITH YOUR REGULAR TOOTHPASTE  Please do not use if you have an allergy to CHG or antibacterial soaps. If your skin becomes reddened/irritated stop using the CHG.  Do not shave (including legs and underarms) for at least 48 hours prior to first CHG shower. It is OK to shave your face.  Please follow these instructions carefully.   1. Shower the NIGHT BEFORE SURGERY and the MORNING OF SURGERY with CHG.   2. If you chose to wash your hair, wash your hair first as usual with your normal shampoo.  3. After you shampoo, rinse your hair and body thoroughly to remove the shampoo.  4. Use CHG as you would any other liquid soap. You can apply CHG directly to the skin and wash gently with a scrungie or a clean washcloth.   5. Apply the CHG Soap to your body ONLY FROM THE NECK DOWN.  Do not use on open wounds or open sores. Avoid contact with your eyes, ears, mouth and genitals (private parts). Wash Face and genitals (private parts)  with your normal soap.  6. Wash thoroughly, paying special attention to the area where your surgery will be performed.  7. Thoroughly rinse your body with warm water from the neck down.  8. DO NOT shower/wash with your normal soap after using and rinsing off the CHG Soap.  9. Pat yourself dry  with a CLEAN TOWEL.  10. Wear CLEAN PAJAMAS to bed the night before surgery, wear comfortable clothes the morning of surgery  11. Place CLEAN SHEETS on your bed the night of your first shower and DO NOT SLEEP WITH PETS.    Day of Surgery:  Do not apply any deodorants/lotions.  Please wear clean clothes to the hospital/surgery center.   Remember to brush your teeth WITH YOUR REGULAR TOOTHPASTE.    Please read over the following fact sheets that you were given. Pain Booklet, Coughing and Deep Breathing, MRSA Information and Surgical Site Infection Prevention

## 2017-11-14 ENCOUNTER — Other Ambulatory Visit: Payer: Self-pay

## 2017-11-14 ENCOUNTER — Encounter (HOSPITAL_COMMUNITY): Payer: Self-pay

## 2017-11-14 ENCOUNTER — Encounter (HOSPITAL_COMMUNITY)
Admission: RE | Admit: 2017-11-14 | Discharge: 2017-11-14 | Disposition: A | Discharge status: Home / Self Care | Payer: Self-pay | Source: Ambulatory Visit | Attending: Orthopedic Surgery | Admitting: Orthopedic Surgery

## 2017-11-14 DIAGNOSIS — Z01812 Encounter for preprocedural laboratory examination: Secondary | ICD-10-CM | POA: Insufficient documentation

## 2017-11-14 HISTORY — DX: Depression, unspecified: F32.A

## 2017-11-14 HISTORY — DX: Major depressive disorder, single episode, unspecified: F32.9

## 2017-11-14 LAB — CBC
HEMATOCRIT: 42.8 % (ref 36.0–46.0)
HEMOGLOBIN: 14.3 g/dL (ref 12.0–15.0)
MCH: 32.9 pg (ref 26.0–34.0)
MCHC: 33.4 g/dL (ref 30.0–36.0)
MCV: 98.4 fL (ref 78.0–100.0)
Platelets: 343 10*3/uL (ref 150–400)
RBC: 4.35 MIL/uL (ref 3.87–5.11)
RDW: 13 % (ref 11.5–15.5)
WBC: 7.2 10*3/uL (ref 4.0–10.5)

## 2017-11-14 LAB — COMPREHENSIVE METABOLIC PANEL WITH GFR
ALT: 15 U/L (ref 14–54)
AST: 19 U/L (ref 15–41)
Albumin: 3.8 g/dL (ref 3.5–5.0)
Alkaline Phosphatase: 56 U/L (ref 38–126)
Anion gap: 10 (ref 5–15)
BUN: 5 mg/dL — ABNORMAL LOW (ref 6–20)
CO2: 27 mmol/L (ref 22–32)
Calcium: 9.3 mg/dL (ref 8.9–10.3)
Chloride: 103 mmol/L (ref 101–111)
Creatinine, Ser: 0.63 mg/dL (ref 0.44–1.00)
GFR calc Af Amer: >60 mL/min
GFR calc non Af Amer: >60 mL/min
Glucose, Bld: 94 mg/dL (ref 65–99)
Potassium: 3.8 mmol/L (ref 3.5–5.1)
Sodium: 140 mmol/L (ref 135–145)
Total Bilirubin: 0.6 mg/dL (ref 0.3–1.2)
Total Protein: 7.1 g/dL (ref 6.5–8.1)

## 2017-11-14 LAB — SURGICAL PCR SCREEN
MRSA, PCR: NEGATIVE
Staphylococcus aureus: NEGATIVE

## 2017-11-14 NOTE — Progress Notes (Addendum)
PCP - Tora PerchesMatthew Phillips Cardiologist - denies cardiac hx or cardiac workup  Unable to get temperature at PAT appointment prior to pt leaving. Pt called to see if she can check temp at home, surgery is tomorrow. Unable to reach patient by phone, will attempt again. Pt stated at PAT appointment, she has no current signs of infection.  Patient denies shortness of breath, fever, cough and chest pain at PAT appointment   Patient verbalized understanding of instructions that were given to them at the PAT appointment. Patient was also instructed that they will need to review over the PAT instructions again at home before surgery.

## 2017-11-15 ENCOUNTER — Encounter (HOSPITAL_COMMUNITY): Payer: Self-pay | Admitting: General Practice

## 2017-11-15 ENCOUNTER — Ambulatory Visit (HOSPITAL_COMMUNITY): Payer: Worker's Compensation | Admitting: Anesthesiology

## 2017-11-15 ENCOUNTER — Observation Stay (HOSPITAL_COMMUNITY)
Admission: RE | Admit: 2017-11-15 | Discharge: 2017-11-15 | Disposition: A | Discharge status: Home / Self Care | Payer: Worker's Compensation | Source: Ambulatory Visit | Attending: Orthopedic Surgery | Admitting: Orthopedic Surgery

## 2017-11-15 ENCOUNTER — Ambulatory Visit (HOSPITAL_COMMUNITY): Payer: Worker's Compensation

## 2017-11-15 ENCOUNTER — Ambulatory Visit (HOSPITAL_COMMUNITY)
Admission: RE | Disposition: A | Discharge status: Home / Self Care | Payer: Self-pay | Source: Ambulatory Visit | Attending: Orthopedic Surgery

## 2017-11-15 DIAGNOSIS — Z419 Encounter for procedure for purposes other than remedying health state, unspecified: Secondary | ICD-10-CM

## 2017-11-15 DIAGNOSIS — M533 Sacrococcygeal disorders, not elsewhere classified: Principal | ICD-10-CM | POA: Insufficient documentation

## 2017-11-15 DIAGNOSIS — J449 Chronic obstructive pulmonary disease, unspecified: Secondary | ICD-10-CM | POA: Insufficient documentation

## 2017-11-15 DIAGNOSIS — Z87891 Personal history of nicotine dependence: Secondary | ICD-10-CM | POA: Insufficient documentation

## 2017-11-15 DIAGNOSIS — M4328 Fusion of spine, sacral and sacrococcygeal region: Secondary | ICD-10-CM | POA: Diagnosis present

## 2017-11-15 HISTORY — PX: SACROILIAC JOINT FUSION: SHX6088

## 2017-11-15 SURGERY — SACROILIAC JOINT FUSION
Anesthesia: General | Site: Spine Lumbar | Laterality: Right

## 2017-11-15 MED ORDER — PHENYLEPHRINE HCL 10 MG/ML IJ SOLN
INTRAVENOUS | Status: DC | PRN
  Administered 2017-11-15: 25 ug/min via INTRAVENOUS

## 2017-11-15 MED ORDER — PREGABALIN 50 MG PO CAPS: 200.0000 mg | ORAL_CAPSULE | Freq: Every day | ORAL | Status: DC

## 2017-11-15 MED ORDER — OXYCODONE-ACETAMINOPHEN 10-325 MG PO TABS: 1.0000 | ORAL_TABLET | Freq: Four times a day (QID) | ORAL | 0 refills | Status: AC | PRN

## 2017-11-15 MED ORDER — TIZANIDINE HCL 4 MG PO TABS: 4.0000 mg | ORAL_TABLET | Freq: Every day | ORAL | Status: DC

## 2017-11-15 MED ORDER — FENTANYL CITRATE (PF) 250 MCG/5ML IJ SOLN
INTRAMUSCULAR | Status: DC | PRN
  Administered 2017-11-15: 100 ug via INTRAVENOUS
  Administered 2017-11-15 (×3): 50 ug via INTRAVENOUS

## 2017-11-15 MED ORDER — CEFAZOLIN SODIUM-DEXTROSE 2-4 GM/100ML-% IV SOLN
2.0000 g | INTRAVENOUS | Status: AC
  Administered 2017-11-15: 2 g via INTRAVENOUS
  Filled 2017-11-15: qty 100

## 2017-11-15 MED ORDER — KETOROLAC TROMETHAMINE 15 MG/ML IJ SOLN
7.5000 mg | Freq: Four times a day (QID) | INTRAMUSCULAR | Status: DC
  Administered 2017-11-15: 7.5 mg via INTRAVENOUS
  Filled 2017-11-15: qty 1

## 2017-11-15 MED ORDER — LACTATED RINGERS IV SOLN: INTRAVENOUS | Status: DC

## 2017-11-15 MED ORDER — MENTHOL 3 MG MT LOZG: 1.0000 | LOZENGE | OROMUCOSAL | Status: DC | PRN

## 2017-11-15 MED ORDER — SERTRALINE HCL 50 MG PO TABS: 50.0000 mg | ORAL_TABLET | Freq: Every day | ORAL | Status: DC

## 2017-11-15 MED ORDER — MOMETASONE FURO-FORMOTEROL FUM 200-5 MCG/ACT IN AERO
2.0000 | INHALATION_SPRAY | Freq: Every day | RESPIRATORY_TRACT | Status: DC
  Filled 2017-11-15: qty 8.8

## 2017-11-15 MED ORDER — ONDANSETRON HCL 4 MG/2ML IJ SOLN: 4.0000 mg | Freq: Four times a day (QID) | INTRAMUSCULAR | Status: DC | PRN

## 2017-11-15 MED ORDER — MIDAZOLAM HCL 2 MG/2ML IJ SOLN
INTRAMUSCULAR | Status: AC
  Filled 2017-11-15: qty 2

## 2017-11-15 MED ORDER — FENTANYL CITRATE (PF) 250 MCG/5ML IJ SOLN
INTRAMUSCULAR | Status: AC
  Filled 2017-11-15: qty 5

## 2017-11-15 MED ORDER — KETOROLAC TROMETHAMINE 30 MG/ML IJ SOLN
INTRAMUSCULAR | Status: DC | PRN
  Administered 2017-11-15: 30 mg via INTRAVENOUS

## 2017-11-15 MED ORDER — LIDOCAINE 2% (20 MG/ML) 5 ML SYRINGE
INTRAMUSCULAR | Status: DC | PRN
  Administered 2017-11-15: 50 mg via INTRAVENOUS

## 2017-11-15 MED ORDER — SODIUM CHLORIDE 0.9% FLUSH: 3.0000 mL | INTRAVENOUS | Status: DC | PRN

## 2017-11-15 MED ORDER — CEFAZOLIN SODIUM-DEXTROSE 1-4 GM/50ML-% IV SOLN: 1.0000 g | Freq: Three times a day (TID) | INTRAVENOUS | Status: DC

## 2017-11-15 MED ORDER — LACTATED RINGERS IV SOLN
INTRAVENOUS | Status: DC | PRN
  Administered 2017-11-15: 07:00:00 via INTRAVENOUS

## 2017-11-15 MED ORDER — HYDROXYZINE PAMOATE 25 MG PO CAPS
25.0000 mg | ORAL_CAPSULE | Freq: Every day | ORAL | Status: DC
  Filled 2017-11-15: qty 1

## 2017-11-15 MED ORDER — BUPIVACAINE-EPINEPHRINE (PF) 0.25% -1:200000 IJ SOLN
INTRAMUSCULAR | Status: AC
  Filled 2017-11-15: qty 30

## 2017-11-15 MED ORDER — PHENYLEPHRINE 40 MCG/ML (10ML) SYRINGE FOR IV PUSH (FOR BLOOD PRESSURE SUPPORT)
PREFILLED_SYRINGE | INTRAVENOUS | Status: DC | PRN
  Administered 2017-11-15 (×3): 80 ug via INTRAVENOUS

## 2017-11-15 MED ORDER — 0.9 % SODIUM CHLORIDE (POUR BTL) OPTIME
TOPICAL | Status: DC | PRN
  Administered 2017-11-15: 1000 mL

## 2017-11-15 MED ORDER — FENTANYL CITRATE (PF) 100 MCG/2ML IJ SOLN: 25.0000 ug | INTRAMUSCULAR | Status: DC | PRN

## 2017-11-15 MED ORDER — PROPOFOL 10 MG/ML IV BOLUS
INTRAVENOUS | Status: DC | PRN
  Administered 2017-11-15: 120 mg via INTRAVENOUS

## 2017-11-15 MED ORDER — PROPOFOL 10 MG/ML IV BOLUS
INTRAVENOUS | Status: AC
  Filled 2017-11-15: qty 20

## 2017-11-15 MED ORDER — OXYCODONE HCL 5 MG PO TABS
10.0000 mg | ORAL_TABLET | ORAL | Status: DC | PRN
  Administered 2017-11-15: 10 mg via ORAL
  Filled 2017-11-15: qty 2

## 2017-11-15 MED ORDER — ACETAMINOPHEN 325 MG PO TABS: 650.0000 mg | ORAL_TABLET | ORAL | Status: DC | PRN

## 2017-11-15 MED ORDER — PHENOL 1.4 % MT LIQD: 1.0000 | OROMUCOSAL | Status: DC | PRN

## 2017-11-15 MED ORDER — SODIUM CHLORIDE 0.9% FLUSH: 3.0000 mL | Freq: Two times a day (BID) | INTRAVENOUS | Status: DC

## 2017-11-15 MED ORDER — ONDANSETRON HCL 4 MG PO TABS
4.0000 mg | ORAL_TABLET | Freq: Four times a day (QID) | ORAL | Status: DC | PRN
  Administered 2017-11-15: 4 mg via ORAL
  Filled 2017-11-15: qty 1

## 2017-11-15 MED ORDER — BUPIVACAINE-EPINEPHRINE 0.25% -1:200000 IJ SOLN
INTRAMUSCULAR | Status: DC | PRN
  Administered 2017-11-15: 50 mL

## 2017-11-15 MED ORDER — OXYCODONE HCL 5 MG PO TABS: 5.0000 mg | ORAL_TABLET | ORAL | Status: DC | PRN

## 2017-11-15 MED ORDER — EPHEDRINE SULFATE 50 MG/ML IJ SOLN
INTRAMUSCULAR | Status: DC | PRN
  Administered 2017-11-15: 10 mg via INTRAVENOUS

## 2017-11-15 MED ORDER — FENTANYL CITRATE (PF) 100 MCG/2ML IJ SOLN
INTRAMUSCULAR | Status: AC
  Administered 2017-11-15: 50 ug via INTRAVENOUS
  Filled 2017-11-15: qty 2

## 2017-11-15 MED ORDER — ONDANSETRON HCL 4 MG/2ML IJ SOLN
INTRAMUSCULAR | Status: DC | PRN
  Administered 2017-11-15: 4 mg via INTRAVENOUS

## 2017-11-15 MED ORDER — ROCURONIUM BROMIDE 10 MG/ML (PF) SYRINGE
PREFILLED_SYRINGE | INTRAVENOUS | Status: DC | PRN
  Administered 2017-11-15: 40 mg via INTRAVENOUS

## 2017-11-15 MED ORDER — FENTANYL CITRATE (PF) 100 MCG/2ML IJ SOLN
25.0000 ug | INTRAMUSCULAR | Status: DC | PRN
  Administered 2017-11-15 (×2): 50 ug via INTRAVENOUS

## 2017-11-15 MED ORDER — BUPIVACAINE LIPOSOME 1.3 % IJ SUSP
20.0000 mL | INTRAMUSCULAR | Status: AC
  Administered 2017-11-15: 50 mL
  Filled 2017-11-15: qty 20

## 2017-11-15 MED ORDER — DEXAMETHASONE SODIUM PHOSPHATE 10 MG/ML IJ SOLN
INTRAMUSCULAR | Status: DC | PRN
  Administered 2017-11-15: 10 mg via INTRAVENOUS

## 2017-11-15 MED ORDER — TIZANIDINE HCL 4 MG PO TABS: 4.0000 mg | ORAL_TABLET | Freq: Three times a day (TID) | ORAL | 0 refills | Status: AC | PRN

## 2017-11-15 MED ORDER — MIDAZOLAM HCL 5 MG/5ML IJ SOLN
INTRAMUSCULAR | Status: DC | PRN
  Administered 2017-11-15: 2 mg via INTRAVENOUS

## 2017-11-15 MED ORDER — ONDANSETRON HCL 4 MG PO TABS: 4.0000 mg | ORAL_TABLET | Freq: Three times a day (TID) | ORAL | 0 refills | Status: AC | PRN

## 2017-11-15 MED ORDER — ACETAMINOPHEN 650 MG RE SUPP: 650.0000 mg | RECTAL | Status: DC | PRN

## 2017-11-15 SURGICAL SUPPLY — 57 items
BLADE CLIPPER SURG (BLADE) IMPLANT
BLADE SURG 11 STRL SS (BLADE) ×3 IMPLANT
CLOSURE STERI-STRIP 1/2X4 (GAUZE/BANDAGES/DRESSINGS) ×1
CLOSURE WOUND 1/2 X4 (GAUZE/BANDAGES/DRESSINGS) ×1
CLSR STERI-STRIP ANTIMIC 1/2X4 (GAUZE/BANDAGES/DRESSINGS) ×2 IMPLANT
COVER SURGICAL LIGHT HANDLE (MISCELLANEOUS) ×3 IMPLANT
DRAPE C-ARM 42X72 X-RAY (DRAPES) ×3 IMPLANT
DRAPE C-ARMOR (DRAPES) ×3 IMPLANT
DRAPE SURG 17X23 STRL (DRAPES) ×3 IMPLANT
DRAPE U-SHAPE 47X51 STRL (DRAPES) ×3 IMPLANT
DRSG OPSITE POSTOP 3X4 (GAUZE/BANDAGES/DRESSINGS) ×3 IMPLANT
DRSG OPSITE POSTOP 4X6 (GAUZE/BANDAGES/DRESSINGS) ×3 IMPLANT
DURAPREP 26ML APPLICATOR (WOUND CARE) ×3 IMPLANT
ELECT BLADE 4.0 EZ CLEAN MEGAD (MISCELLANEOUS) ×3
ELECT PENCIL ROCKER SW 15FT (MISCELLANEOUS) ×3 IMPLANT
ELECT REM PT RETURN 9FT ADLT (ELECTROSURGICAL) ×3
ELECTRODE BLDE 4.0 EZ CLN MEGD (MISCELLANEOUS) ×1 IMPLANT
ELECTRODE REM PT RTRN 9FT ADLT (ELECTROSURGICAL) ×1 IMPLANT
GLOVE BIOGEL PI IND STRL 8 (GLOVE) ×2 IMPLANT
GLOVE BIOGEL PI IND STRL 8.5 (GLOVE) ×1 IMPLANT
GLOVE BIOGEL PI INDICATOR 8 (GLOVE) ×4
GLOVE BIOGEL PI INDICATOR 8.5 (GLOVE) ×2
GLOVE ECLIPSE 7.5 STRL STRAW (GLOVE) ×6 IMPLANT
GLOVE SS BIOGEL STRL SZ 8.5 (GLOVE) ×2 IMPLANT
GLOVE SUPERSENSE BIOGEL SZ 8.5 (GLOVE) ×4
GOWN STRL REUS W/ TWL LRG LVL3 (GOWN DISPOSABLE) ×1 IMPLANT
GOWN STRL REUS W/TWL 2XL LVL3 (GOWN DISPOSABLE) ×3 IMPLANT
GOWN STRL REUS W/TWL LRG LVL3 (GOWN DISPOSABLE) ×2
IMPL IFUSE 3D 7X35 (Rod) ×1 IMPLANT
IMPL IFUSE 7.0X50 (Rod) ×1 IMPLANT
IMPLANT IFUSE 3D 7X35 (Rod) ×3 IMPLANT
IMPLANT IFUSE 7.0MMX40MM (Rod) ×2 IMPLANT
IMPLANT IFUSE 7.0X50 (Rod) ×3 IMPLANT
KIT BASIN OR (CUSTOM PROCEDURE TRAY) ×3 IMPLANT
KIT TURNOVER KIT B (KITS) ×3 IMPLANT
NEEDLE 22X1 1/2 (OR ONLY) (NEEDLE) ×3 IMPLANT
NS IRRIG 1000ML POUR BTL (IV SOLUTION) ×3 IMPLANT
PACK LAMINECTOMY ORTHO (CUSTOM PROCEDURE TRAY) ×3 IMPLANT
PACK UNIVERSAL I (CUSTOM PROCEDURE TRAY) ×3 IMPLANT
PAD ARMBOARD 7.5X6 YLW CONV (MISCELLANEOUS) ×6 IMPLANT
POSITIONER HEAD PRONE TRACH (MISCELLANEOUS) ×3 IMPLANT
STAPLER VISISTAT 35W (STAPLE) ×3 IMPLANT
STRIP CLOSURE SKIN 1/2X4 (GAUZE/BANDAGES/DRESSINGS) ×2 IMPLANT
SUT MNCRL AB 3-0 PS2 18 (SUTURE) IMPLANT
SUT MON AB 3-0 SH 27 (SUTURE) ×2
SUT MON AB 3-0 SH27 (SUTURE) ×1 IMPLANT
SUT VIC AB 1 CT1 18XCR BRD 8 (SUTURE) ×1 IMPLANT
SUT VIC AB 1 CT1 8-18 (SUTURE) ×2
SUT VIC AB 2-0 CT1 18 (SUTURE) ×3 IMPLANT
SYR BULB IRRIGATION 50ML (SYRINGE) ×3 IMPLANT
SYR CONTROL 10ML LL (SYRINGE) ×3 IMPLANT
SYS SPNL FX3ANG 40X7X (Rod) ×1 IMPLANT
SYSTEM SPNL FX3ANG 40X7X (Rod) ×1 IMPLANT
TOWEL OR 17X24 6PK STRL BLUE (TOWEL DISPOSABLE) ×3 IMPLANT
TOWEL OR 17X26 10 PK STRL BLUE (TOWEL DISPOSABLE) ×3 IMPLANT
WATER STERILE IRR 1000ML POUR (IV SOLUTION) ×3 IMPLANT
YANKAUER SUCT BULB TIP NO VENT (SUCTIONS) ×3 IMPLANT

## 2017-11-15 NOTE — Progress Notes (Signed)
Pt doing well. Pt and daughter given D/C instructions with Rx's, verbal understanding was provided. Pt's incision is clean and dry with no sign of infection. Pt's IV was removed prior to D/C. Pt D/C'd home via wheelchair @ 1700 per MD order. Pt is stable @ D/C and has no other needs at this time. Rema FendtAshley Reyne Falconi, RN

## 2017-11-15 NOTE — Anesthesia Preprocedure Evaluation (Addendum)
Anesthesia Evaluation  Patient identified by MRN, date of birth, ID band Patient awake    Reviewed: Allergy & Precautions, NPO status , Patient's Chart, lab work & pertinent test results  History of Anesthesia Complications (+) PONV  Airway Mallampati: II  TM Distance: >3 FB     Dental   Pulmonary shortness of breath, COPD, former smoker,    breath sounds clear to auscultation       Cardiovascular negative cardio ROS   Rhythm:Regular Rate:Normal     Neuro/Psych    GI/Hepatic negative GI ROS, Neg liver ROS,   Endo/Other  negative endocrine ROS  Renal/GU negative Renal ROS     Musculoskeletal  (+) Arthritis ,   Abdominal   Peds  Hematology   Anesthesia Other Findings   Reproductive/Obstetrics                            Anesthesia Physical Anesthesia Plan  ASA: III  Anesthesia Plan: General   Post-op Pain Management:    Induction: Intravenous  PONV Risk Score and Plan: 4 or greater and Treatment may vary due to age or medical condition  Airway Management Planned:   Additional Equipment:   Intra-op Plan:   Post-operative Plan: Extubation in OR  Informed Consent: I have reviewed the patients History and Physical, chart, labs and discussed the procedure including the risks, benefits and alternatives for the proposed anesthesia with the patient or authorized representative who has indicated his/her understanding and acceptance.   Dental advisory given  Plan Discussed with: CRNA and Anesthesiologist  Anesthesia Plan Comments:         Anesthesia Quick Evaluation

## 2017-11-15 NOTE — Transfer of Care (Signed)
Immediate Anesthesia Transfer of Care Note  Patient: Catherine Lang  Procedure(s) Performed: SACROILIAC JOINT FUSION (Right Spine Lumbar)  Patient Location: PACU  Anesthesia Type:General  Level of Consciousness: awake, alert  and oriented  Airway & Oxygen Therapy: Patient Spontanous Breathing and Patient connected to nasal cannula oxygen  Post-op Assessment: Report given to RN, Post -op Vital signs reviewed and stable and Patient moving all extremities X 4  Post vital signs: Reviewed and stable  Last Vitals:  Vitals Value Taken Time  BP 123/74 11/15/2017  9:05 AM  Temp 36.3 C 11/15/2017  9:05 AM  Pulse 94 11/15/2017  9:10 AM  Resp 20 11/15/2017  9:10 AM  SpO2 100 % 11/15/2017  9:10 AM  Vitals shown include unvalidated device data.  Last Pain:  Vitals:   11/15/17 0905  PainSc: 7       Patients Stated Pain Goal: 2 (11/15/17 [id number])  Complications: No apparent anesthesia complications

## 2017-11-15 NOTE — Evaluation (Signed)
Physical Therapy Evaluation and Discharge Patient Details Name: Catherine Lang MRN: [id number] DOB: 01-Jun-1956 Today's Date: 11/15/2017   History of Present Illness  Pt is a 62 y/o female s/p SI joint fusion. PMH includes COPD and lumbar fusion.   Clinical Impression  Patient evaluated by Physical Therapy with no further acute PT needs identified. All education has been completed and the patient has no further questions. Pt requiring gross supervision throughout gait. Demonstrated good technique with use of RW and maintaining NWB on RLE. Pt refusing stair navigation as she practiced before surgery with PT and verbalized good understanding of technique. Pt will have daughter to assist her at home. Reports she does not feel like she needs further skilled PT at this time and is eager to go home today.  See below for any follow-up Physical Therapy or equipment needs. PT is signing off. Thank you for this referral. If needs change, please reconsult.      Follow Up Recommendations No PT follow up;Supervision for mobility/OOB    Equipment Recommendations  None recommended by PT    Recommendations for Other Services       Precautions / Restrictions Precautions Precautions: None Restrictions Weight Bearing Restrictions: Yes RLE Weight Bearing: Non weight bearing      Mobility  Bed Mobility Overal bed mobility: Modified Independent             General bed mobility comments: Increased time required.   Transfers Overall transfer level: Needs assistance Equipment used: Rolling walker (2 wheeled) Transfers: Sit to/from Stand Sit to Stand: Supervision         General transfer comment: Supervision for safety. Cues to kick RLE out prior to sitting in order to maintain NWB.   Ambulation/Gait Ambulation/Gait assistance: Min guard;Supervision Gait Distance (Feet): 50 Feet Assistive device: Rolling walker (2 wheeled) Gait Pattern/deviations: Step-to pattern Gait velocity: Decreased     General Gait Details: Hop to pattern. Safe technique, however, fatigued easy. Required standing rest X 2. Overall steady and able to maintain NWB throughout. Pt reports practicing prior to surgery with PT, and does not feel like she needs any more practice.   Stairs Stairs: Yes       General stair comments: Pt refusing to practice, as she states she practiced with PT prior to surgery. Able to verbalize safe technique.   Wheelchair Mobility    Modified Rankin (Stroke Patients Only)       Balance Overall balance assessment: Needs assistance Sitting-balance support: No upper extremity supported;Feet supported Sitting balance-Leahy Scale: Good     Standing balance support: Bilateral upper extremity supported;During functional activity Standing balance-Leahy Scale: Poor Standing balance comment: Reliant on BUE support.                              Pertinent Vitals/Pain Pain Assessment: 0-10 Pain Score: 4  Pain Location: R SI joint  Pain Descriptors / Indicators: Aching;Operative site guarding Pain Intervention(s): Limited activity within patient's tolerance;Monitored during session;Repositioned    Home Living Family/patient expects to be discharged to:: Private residence Living Arrangements: Children Available Help at Discharge: Family;Available 24 hours/day Type of Home: House Home Access: Stairs to enter Entrance Stairs-Rails: None Entrance Stairs-Number of Steps: 1 Home Layout: One level Home Equipment: Walker - 2 wheels;Walker - 4 wheels;Bedside commode;Tub bench      Prior Function Level of Independence: Independent               Hand Dominance  Extremity/Trunk Assessment   Upper Extremity Assessment Upper Extremity Assessment: Defer to OT evaluation    Lower Extremity Assessment Lower Extremity Assessment: RLE deficits/detail RLE Deficits / Details: Limited ROM secondary to post op pain and weakness    Cervical / Trunk  Assessment Cervical / Trunk Assessment: Other exceptions Cervical / Trunk Exceptions: s/p R SI joint fusion  Communication   Communication: No difficulties  Cognition Arousal/Alertness: Awake/alert Behavior During Therapy: WFL for tasks assessed/performed Overall Cognitive Status: Within Functional Limits for tasks assessed                                        General Comments General comments (skin integrity, edema, etc.): Pt's daughter present during session. Reports comfort in assisting pt at home with mobility and stair navigation.     Exercises     Assessment/Plan    PT Assessment Patent does not need any further PT services  PT Problem List         PT Treatment Interventions      PT Goals (Current goals can be found in the Care Plan section)  Acute Rehab PT Goals Patient Stated Goal: to go home today PT Goal Formulation: With patient/family Time For Goal Achievement: 11/15/17 Potential to Achieve Goals: Good    Frequency     Barriers to discharge        Co-evaluation               AM-PAC PT "6 Clicks" Daily Activity  Outcome Measure Difficulty turning over in bed (including adjusting bedclothes, sheets and blankets)?: None Difficulty moving from lying on back to sitting on the side of the bed? : A Little Difficulty sitting down on and standing up from a chair with arms (e.g., wheelchair, bedside commode, etc,.)?: A Little Help needed moving to and from a bed to chair (including a wheelchair)?: A Little Help needed walking in hospital room?: A Little Help needed climbing 3-5 steps with a railing? : A Little 6 Click Score: 19    End of Session Equipment Utilized During Treatment: Gait belt Activity Tolerance: Patient tolerated treatment well Patient left: in bed;with call bell/phone within reach;with family/visitor present Nurse Communication: Mobility status PT Visit Diagnosis: Other abnormalities of gait and mobility  (R26.89);Pain Pain - Right/Left: Right Pain - part of body: (SI)    Time: [id number] PT Time Calculation (min) (ACUTE ONLY): 17 min   Charges:   PT Evaluation $PT Eval Low Complexity: 1 Low     PT G Codes:        Catherine Lang, PT, DPT  Acute Rehabilitation Services  Pager: [phone]   [facility] PromBrittany S Ileen Kahre 11/15/2017, 1:30 PM

## 2017-11-15 NOTE — Anesthesia Postprocedure Evaluation (Signed)
Anesthesia Post Note  Patient: Catherine Lang  Procedure(s) Performed: SACROILIAC JOINT FUSION (Right Spine Lumbar)     Patient location during evaluation: PACU Anesthesia Type: General Level of consciousness: awake Pain management: pain level controlled Vital Signs Assessment: post-procedure vital signs reviewed and stable Respiratory status: spontaneous breathing Cardiovascular status: stable Anesthetic complications: no    Last Vitals:  Vitals:   11/15/17 1104 11/15/17 1614  BP: 130/74 111/62  Pulse: 86 (!) 104  Resp: 18 18  Temp: 36.8 C   SpO2: 96% 97%    Last Pain:  Vitals:   11/15/17 1110  TempSrc:   PainSc: 3                  Joaquin Knebel

## 2017-11-15 NOTE — Brief Op Note (Signed)
11/15/2017  9:15 AM  PATIENT:  Catherine Lang  62 y.o. female  PRE-OPERATIVE DIAGNOSIS:  Right sacroiliac joint dysfunction  POST-OPERATIVE DIAGNOSIS:  Right sacroiliac joint dysfunctio  PROCEDURE:  Procedure(s) with comments: SACROILIAC JOINT FUSION (Right) - 90 mins  SURGEON:  Surgeon(s) and Role:    Venita Lick* Burech Mcfarland, MD - Primary  PHYSICIAN ASSISTANT:   ASSISTANTS: none   ANESTHESIA:   general  EBL:  25 mL   BLOOD ADMINISTERED:none  DRAINS: none   LOCAL MEDICATIONS USED:  MARCAINE    and OTHER exparel  SPECIMEN:  No Specimen  DISPOSITION OF SPECIMEN:  N/A  COUNTS:  YES  TOURNIQUET:  * No tourniquets in log *  DICTATION: .Dragon Dictation  PLAN OF CARE: Admit for overnight observation  PATIENT DISPOSITION:  PACU - hemodynamically stable.

## 2017-11-15 NOTE — Progress Notes (Signed)
Occupational Therapy Evaluation Patient Details Name: Catherine Lang MRN: [id number] DOB: [birth year] Today's Date: 11/15/2017    History of Present Illness Pt is a 62 y/o female s/p SI joint fusion. PMH includes COPD and lumbar fusion.    Clinical Impression   Completed education with pt/daughter regarding compensatory techniques for ADL and functional mobility for ADL using DME and AE. Pt safe to DC home when medically stable.     Follow Up Recommendations  No OT follow up;Supervision - Intermittent    Equipment Recommendations  None recommended by OT    Recommendations for Other Services       Precautions / Restrictions Precautions Precautions: Other (comment)(precautions as indicated by surgeon; NWB RLE) Restrictions Weight Bearing Restrictions: Yes RLE Weight Bearing: Non weight bearing      Mobility Bed Mobility Overal bed mobility: Modified Independent             General bed mobility comments: Increased time required.   Transfers Overall transfer level: Needs assistance Equipment used: Rolling walker (2 wheeled) Transfers: Sit to/from Stand Sit to Stand: Supervision         General transfer comment: Supervision for safety. Cues to kick RLE out prior to sitting in order to maintain NWB.     Balance Overall balance assessment: Needs assistance Sitting-balance support: No upper extremity supported;Feet supported Sitting balance-Leahy Scale: Good     Standing balance support: Bilateral upper extremity supported;During functional activity Standing balance-Leahy Scale: Poor Standing balance comment: Reliant on BUE support.                            ADL either performed or assessed with clinical judgement   ADL Overall ADL's : Needs assistance/impaired                                     Functional mobility during ADLs: Supervision/safety;Rolling walker General ADL Comments: Educated pt/daughter on compensatory techniques  for ADL and use of  available AE/DEM to maximize independence and safety. REcommend pt use a reacher for ADL; educated on use of long handled sponge and toilet aid if needed as hygiene after toileting has been difficult for pt. Educated on home safety/set up to maximize independence and safety. Educated on proper tub bench set up and technique to get in/out of tub safely     Vision         Perception     Praxis      Pertinent Vitals/Pain Pain Assessment: 0-10 Pain Score: 5  Pain Location: R SI joint  Pain Descriptors / Indicators: Aching;Operative site guarding Pain Intervention(s): Limited activity within patient's tolerance;Patient requesting pain meds-RN notified;Repositioned     Hand Dominance     Extremity/Trunk Assessment Upper Extremity Assessment Upper Extremity Assessment: Overall WFL for tasks assessed   Lower Extremity Assessment Lower Extremity Assessment: Defer to PT evaluation RLE Deficits / Details: Limited ROM secondary to post op pain and weakness   Cervical / Trunk Assessment Cervical / Trunk Assessment: Other exceptions Cervical / Trunk Exceptions: s/p R SI joint fusion   Communication Communication Communication: No difficulties   Cognition Arousal/Alertness: Awake/alert Behavior During Therapy: WFL for tasks assessed/performed Overall Cognitive Status: Within Functional Limits for tasks assessed  General Comments  Daughter present for educaiton    Exercises     Shoulder Instructions      Home Living Family/patient expects to be discharged to:: Private residence Living Arrangements: Children Available Help at Discharge: Family;Available 24 hours/day Type of Home: House Home Access: Stairs to enter [employer] of Steps: 1 Entrance Stairs-Rails: None Home Layout: One level     Bathroom Shower/Tub: Chief Strategy Officer: Standard Bathroom Accessibility: Yes How  Accessible: Accessible via walker Home Equipment: Walker - 2 wheels;Walker - 4 wheels;Bedside commode;Tub bench          Prior Functioning/Environment Level of Independence: Independent                 OT Problem List: Decreased strength;Decreased range of motion;Impaired balance (sitting and/or standing);Decreased safety awareness;Decreased knowledge of use of DME or AE;Decreased knowledge of precautions;Pain      OT Treatment/Interventions:      OT Goals(Current goals can be found in the care plan section) Acute Rehab OT Goals Patient Stated Goal: to go home today OT Goal Formulation: All assessment and education complete, DC therapy  OT Frequency:     Barriers to D/C:            Co-evaluation              AM-PAC PT "6 Clicks" Daily Activity     Outcome Measure Help from another person eating meals?: None Help from another person taking care of personal grooming?: None Help from another person toileting, which includes using toliet, bedpan, or urinal?: A Little Help from another person bathing (including washing, rinsing, drying)?: A Little Help from another person to put on and taking off regular upper body clothing?: None Help from another person to put on and taking off regular lower body clothing?: A Little 6 Click Score: 21   End of Session Equipment Utilized During Treatment: Rolling walker Nurse Communication: Mobility status  Activity Tolerance: Patient tolerated treatment well Patient left: in bed;with family/visitor present  OT Visit Diagnosis: Unsteadiness on feet (R26.81);Pain Pain - Right/Left: Right Pain - part of body: Leg                Time: 1610-9604 OT Time Calculation (min): 17 min Charges:  OT General Charges $OT Visit: 1 Visit OT Evaluation $OT Eval Low Complexity: 1 Low G-Codes:     Luisa Dago, OT/L  OT Clinical Specialist [phone]   [facility] 11/15/2017, 1:46 PM

## 2017-11-15 NOTE — Anesthesia Procedure Notes (Signed)
Procedure Name: Intubation Date/Time: 11/15/2017 7:43 AM Performed by: Mariea Clonts, CRNA Pre-anesthesia Checklist: Patient identified, Emergency Drugs available, Suction available and Patient being monitored Patient Re-evaluated:Patient Re-evaluated prior to induction Oxygen Delivery Method: Circle System Utilized Preoxygenation: Pre-oxygenation with 100% oxygen Induction Type: IV induction Ventilation: Mask ventilation without difficulty Laryngoscope Size: Mac and 3 Grade View: Grade I Tube type: Oral Tube size: 7.0 mm Number of attempts: 1 Airway Equipment and Method: Stylet and Oral airway Placement Confirmation: ETT inserted through vocal cords under direct vision,  positive ETCO2 and breath sounds checked- equal and bilateral Tube secured with: Tape Dental Injury: Teeth and Oropharynx as per pre-operative assessment

## 2017-11-15 NOTE — Op Note (Signed)
Operative report  Preoperative diagnosis: Right SI joint dysfunction  Postop diagnosis: Same  Operative procedure: Right SI joint fusion  Implants:iFuse 3D implant  7.0 x 50mm; 7.0 x 40mm; 7.0 x 35  Condition: Stable.  No complications  Indications: This is a very pleasant 62 year old woman who had a previous L5-S1 anterior interbody fusion several years ago and has done well until recently.  She started having significant posterior right SI joint pain.  Attempts at conservative management failed to alleviate her pain improve her quality of life.  SI injections confirmed this as the pain generator.  As a result of the failure of conservative management we elected to proceed with surgery.  All appropriate risks benefits and alternatives were discussed with the patient and consent was obtained  Operative report: Patient was brought the operating room placed upon the operating table.  After successful induction of general anesthesia and endotracheal ablation teds SCDs were applied she was turned prone onto the chest and pelvic gel roll.  The arm was placed overhead and all bony prominences were well-padded.  The right posterior gluteal region was then prepped and draped in a standard fashion.  Timeout was taken to confirm patient procedure and all other important data.  Once this was completed I identified the anatomical outline of the sacrum.  I then advanced the pin percutaneously top portion of the sacrum.  I confirmed satisfactory position in the lateral plane.  I then advanced the pin into the iliac wing and then confirmed trajectory in both the inlet and outlet views.  I advanced the pin across the SI joint into the sacrum.  Care was taken to identify the neuroforamen and remain lateral to it.  Once the first pin was then I used the targeting device to place the second and third pins in a similar fashion.  Once all 3 pins were across the SI joint and into the sacrum I rechecked the inlet, outlet and  lateral view confirming position and trajectory.  At this point 11 blade scalpel was used to create an incision spanning the 3 pin sites.  I then used the tissue separator and then placed the drill guide over the first pin.  I measured and then confirmed satisfactory position of my guide and then placed the broach.  The broach was advanced through the iliac wing and across the SI joint into the sacrum.  The broach was removed and the top screw was placed.  This is a 7.0 x 50.  The guidepin was removed and then I repeated the procedure the middle and bottom screw.  The middle screw measured 7.0 x 40, and the bottom screw measured 7.0 x 35.  Again with each screw I checked the inlet and outlet views as well as the lateral confirming trajectory in final position.  Once all 3 implants were across the SI joint I took my final x-rays.  These were all satisfactory the implants were properly positioned and well-seated bone and all across the sacroiliac joint into the sacrum.  The wound was then copiously irrigated with normal saline and closed in a layered fashion with interrupted #1 Vicryl suture, 2-0 Vicryl suture, and 3-0 Monocryl.  A total of 50 cc of local anesthetic was then injected.  This consisted of 20 cc of quarter percent Marcaine with epinephrine, and 30 cc of Exparel.  Steri-Strips and a dry dressing were then applied and the patient was ultimately extubated and transferred the PACU without incident.  The end of the  case all needle sponge counts were correct.  There are no adverse intraoperative

## 2017-11-16 ENCOUNTER — Encounter (HOSPITAL_COMMUNITY): Payer: Self-pay | Admitting: Orthopedic Surgery

## 2017-11-20 ENCOUNTER — Ambulatory Visit (HOSPITAL_COMMUNITY): Admission: RE | Admit: 2017-11-20 | Payer: Self-pay | Source: Ambulatory Visit

## 2017-11-20 ENCOUNTER — Other Ambulatory Visit (HOSPITAL_COMMUNITY): Payer: Self-pay | Admitting: Orthopedic Surgery

## 2017-11-20 DIAGNOSIS — M7989 Other specified soft tissue disorders: Principal | ICD-10-CM

## 2017-11-20 DIAGNOSIS — M79604 Pain in right leg: Secondary | ICD-10-CM

## 2017-11-20 NOTE — Discharge Summary (Signed)
Physician Discharge Summary  Patient ID: Catherine Lang MRN: [id number] DOB/AGE: [id number] y.o.  Admit date: 11/15/2017 Discharge date: 11/15/17  Admission Diagnoses:  Right SI joint dysfunction  Discharge Diagnoses:  Active Problems:   Fusion of sacral region of spine   Past Medical History:  Diagnosis Date  . Arthritis   . Complication of anesthesia    yrs ago had Nausea but no problem in 10/17  . COPD (chronic obstructive pulmonary disease) (HCC)    when cough hard throw up- 1-2 tx week  . Depression   . Dyspnea    with exersion  . PONV (postoperative nausea and vomiting)     Surgeries: Procedure(s): SACROILIAC JOINT FUSION on 11/15/2017   Consultants (if any):   Discharged Condition: Improved  Hospital Course: Catherine Lang is an 62 y.o. female who was admitted 11/15/2017 with a diagnosis of Right SI joint Dysfunction and went to the operating room on 11/15/2017 and underwent the above named procedures. Post op day 0 at 5:00pm pt doing well.  Pts pain well controlled.  No wound abnormalities.  Pt had PT instruction prior to surgery.  Pt understood no ambulation on Right side and was given medical equipment in clinic to accommodate.   After cleared by surgeon, pt DC'd home into the care of her daughter.   She was given perioperative antibiotics:  Anti-infectives (From admission, onward)   Start     Dose/Rate Route Frequency Ordered Stop   11/15/17 1100  ceFAZolin (ANCEF) IVPB 1 g/50 mL premix  Status:  Discontinued     1 g 100 mL/hr over 30 Minutes Intravenous Every 8 hours 11/15/17 1057 11/15/17 2007   11/15/17 0556  ceFAZolin (ANCEF) IVPB 2g/100 mL premix     2 g 200 mL/hr over 30 Minutes Intravenous 30 min pre-op 11/15/17 0556 11/15/17 0747    .  She was given sequential compression devices, early ambulation, and TED for DVT prophylaxis.  She benefited maximally from the hospital stay and there were no complications.    Recent vital signs:  Vitals:   11/15/17  1104 11/15/17 1614  BP: 130/74 111/62  Pulse: 86 (!) 104  Resp: 18 18  Temp: 98.2 F (36.8 C) 98 F (36.7 C)  SpO2: 96% 97%    Recent laboratory studies:  Lab Results  Component Value Date   HGB 14.3 11/14/2017   HGB 13.8 07/19/2016   HGB 14.2 07/12/2016   Lab Results  Component Value Date   WBC 7.2 11/14/2017   PLT 343 11/14/2017   No results found for: INR Lab Results  Component Value Date   NA 140 11/14/2017   K 3.8 11/14/2017   CL 103 11/14/2017   CO2 27 11/14/2017   BUN 5 (L) 11/14/2017   CREATININE 0.63 11/14/2017   GLUCOSE 94 11/14/2017    Discharge Medications:   Allergies as of 11/15/2017      Reactions   Hydrocodone Itching      Medication List    STOP taking these medications   aspirin EC 81 MG tablet   enoxaparin 40 MG/0.4ML injection Commonly known as:  LOVENOX   ibuprofen 200 MG tablet Commonly known as:  ADVIL,MOTRIN   methocarbamol 500 MG tablet Commonly known as:  ROBAXIN   traMADol 50 MG tablet Commonly known as:  ULTRAM     TAKE these medications   APPLE CIDER VINEGAR PO Take 450 mg by mouth at bedtime.   ascorbic acid 1000 MG tablet Commonly known as:  VITAMIN  C Take 1,000 mg by mouth at bedtime.   hydrOXYzine 25 MG capsule Commonly known as:  VISTARIL Take 25 mg by mouth at bedtime.   magnesium oxide 400 MG tablet Commonly known as:  MAG-OX Take 400 mg by mouth at bedtime.   mometasone-formoterol 200-5 MCG/ACT Aero Commonly known as:  DULERA Inhale 2 puffs into the lungs daily.   ondansetron 4 MG tablet Commonly known as:  ZOFRAN Take 1 tablet (4 mg total) by mouth every 8 (eight) hours as needed for nausea or vomiting. What changed:  Another medication with the same name was added. Make sure you understand how and when to take each.   ondansetron 4 MG tablet Commonly known as:  ZOFRAN Take 1 tablet (4 mg total) by mouth every 8 (eight) hours as needed for nausea or vomiting. What changed:  You were already  taking a medication with the same name, and this prescription was added. Make sure you understand how and when to take each.   oxyCODONE-acetaminophen 10-325 MG tablet Commonly known as:  PERCOCET Take 1 tablet by mouth every 6 (six) hours as needed for pain. What changed:  when to take this   pregabalin 100 MG capsule Commonly known as:  LYRICA Take 200 mg by mouth at bedtime.   sertraline 50 MG tablet Commonly known as:  ZOLOFT Take 50 mg by mouth at bedtime.   tiZANidine 4 MG tablet Commonly known as:  ZANAFLEX Take 1 tablet (4 mg total) by mouth every 8 (eight) hours as needed for muscle spasms. What changed:    when to take this  reasons to take this   traZODone 50 MG tablet Commonly known as:  DESYREL Take 50 mg by mouth at bedtime.   VITAMIN B12 PO Take 2,500 Units by mouth at bedtime.   Vitamin D3 25 MCG tablet Commonly known as:  Vitamin D Take 25 mg by mouth at bedtime.       Diagnostic Studies: Dg Si Joints  Result Date: 11/15/2017 CLINICAL DATA:  Right sacroiliac joint fusion EXAM: BILATERAL SACROILIAC JOINTS - 3+ VIEW; DG C-ARM 61-120 MIN COMPARISON:  09/20/2017 sacroiliac joint CT guided intervention FINDINGS: Fluoroscopy time 2 minutes 53 seconds. Multiple spot fluoroscopic nondiagnostic intraoperative radiographs of the right sacroiliac joint demonstrate transfixation of the right sacroiliac joint with 3 pins. Previous anterior spinal fusion at L5-S1. IMPRESSION: Intraoperative fluoroscopic guidance for right sacroiliac joint fusion. Electronically Signed   By: Delbert PhenixJason A Poff M.D.   On: 11/15/2017 09:16   Dg C-arm 1-60 Min  Result Date: 11/15/2017 CLINICAL DATA:  Right sacroiliac joint fusion EXAM: BILATERAL SACROILIAC JOINTS - 3+ VIEW; DG C-ARM 61-120 MIN COMPARISON:  09/20/2017 sacroiliac joint CT guided intervention FINDINGS: Fluoroscopy time 2 minutes 53 seconds. Multiple spot fluoroscopic nondiagnostic intraoperative radiographs of the right sacroiliac  joint demonstrate transfixation of the right sacroiliac joint with 3 pins. Previous anterior spinal fusion at L5-S1. IMPRESSION: Intraoperative fluoroscopic guidance for right sacroiliac joint fusion. Electronically Signed   By: Delbert PhenixJason A Poff M.D.   On: 11/15/2017 09:16    Disposition:  Pt will present to clinic in 2 weeks Post op meds provided Discharge Instructions    Incentive spirometry RT   Complete by:  As directed       Follow-up Information    Venita LickBrooks, Dahari, MD In 2 weeks.   Specialty:  Orthopedic Surgery Why:  If symptoms worsen, For suture removal, For wound re-check Contact information: [address] [place] [address] [phone]  Signed: Kirt Boys 11/20/2017, 8:39 AM

## 2017-12-02 MED FILL — Bupivacaine Liposome Inj 1.3% (13.3 MG/ML): INTRAMUSCULAR | Qty: 20 | Status: AC

## 2018-04-28 IMAGING — RF DG LUMBAR SPINE 2-3V
1 series · 2 of 2 positions shown · non-contrast
Comparison: No recent prior.

CLINICAL DATA: Lumbar fusion.

EXAM:
DG C-ARM 61-120 MIN; LUMBAR SPINE - 2-3 VIEW

[Series 1: run · 2 of 2 slices shown]
[im 1/2]
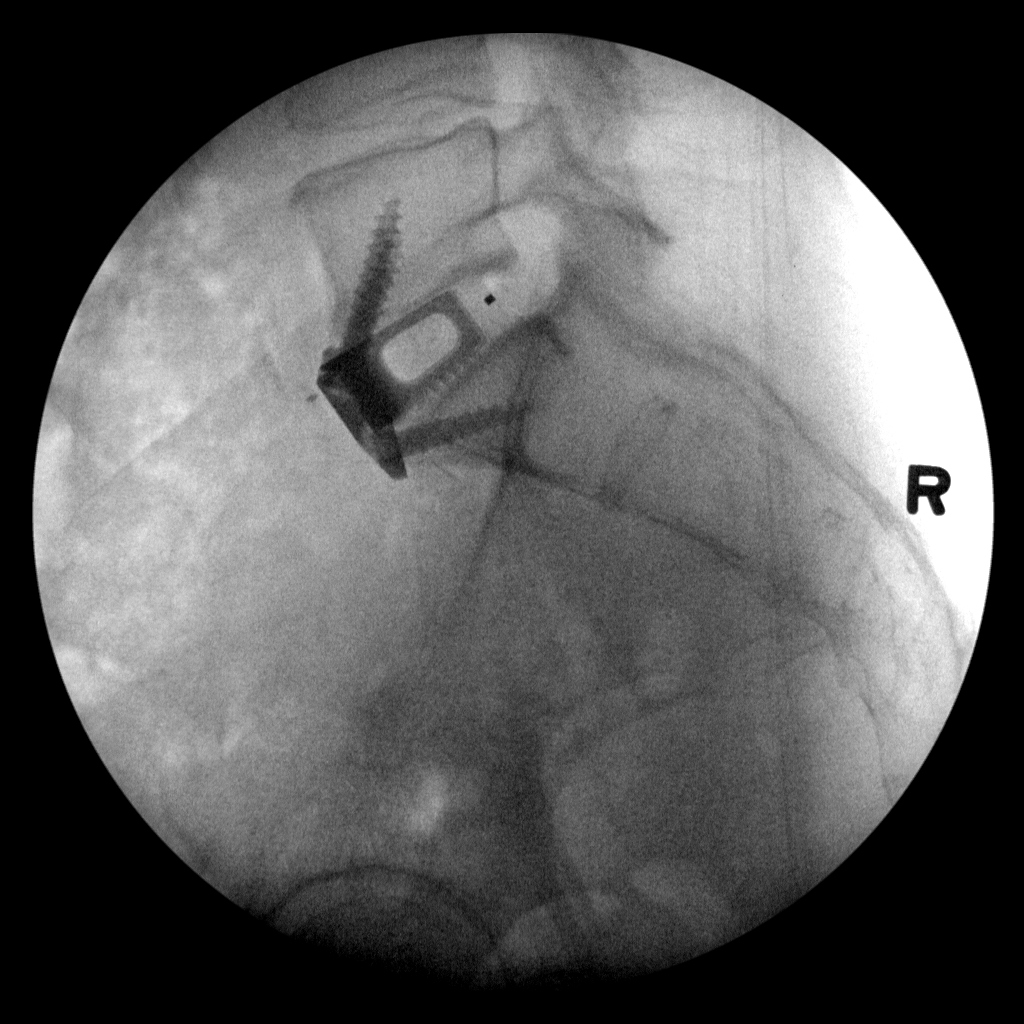
[im 2/2]
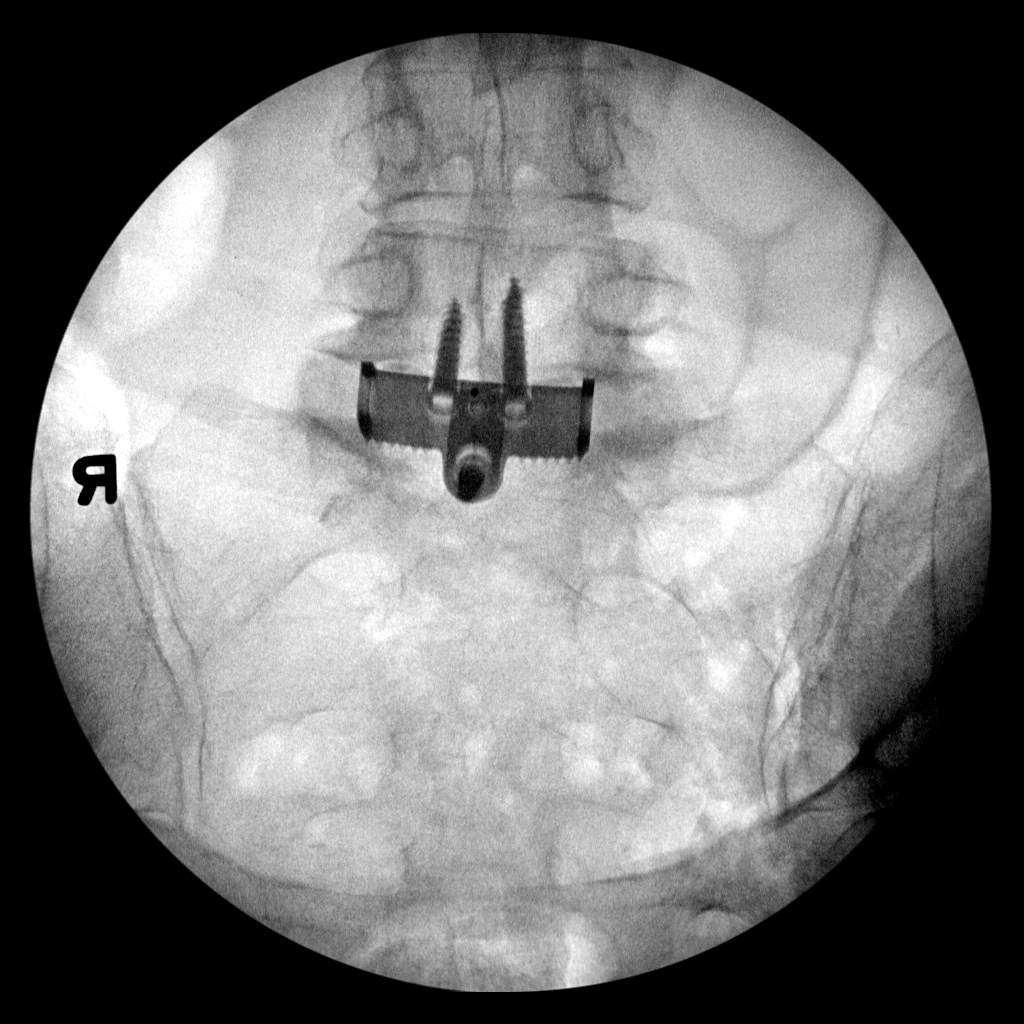

[2 of 2 positions shown; findings below may reference images not displayed]

FINDINGS: Lumbar vertebra number with the lowest lumbar shaped vertebral
lateral view as L5. L5-S1 anterior and interbody fusion. Hardware
intact. Anatomic alignment.
IMPRESSION: L5-S1 anterior interbody fusion.  Hardware intact .

## 2018-04-28 IMAGING — CR DG OR LOCAL ABDOMEN
1 series · 1 of 1 positions shown · non-contrast
Comparison: None

CLINICAL DATA: Abnormal instrument count.

EXAM:
OR LOCAL ABDOMEN

[AP]
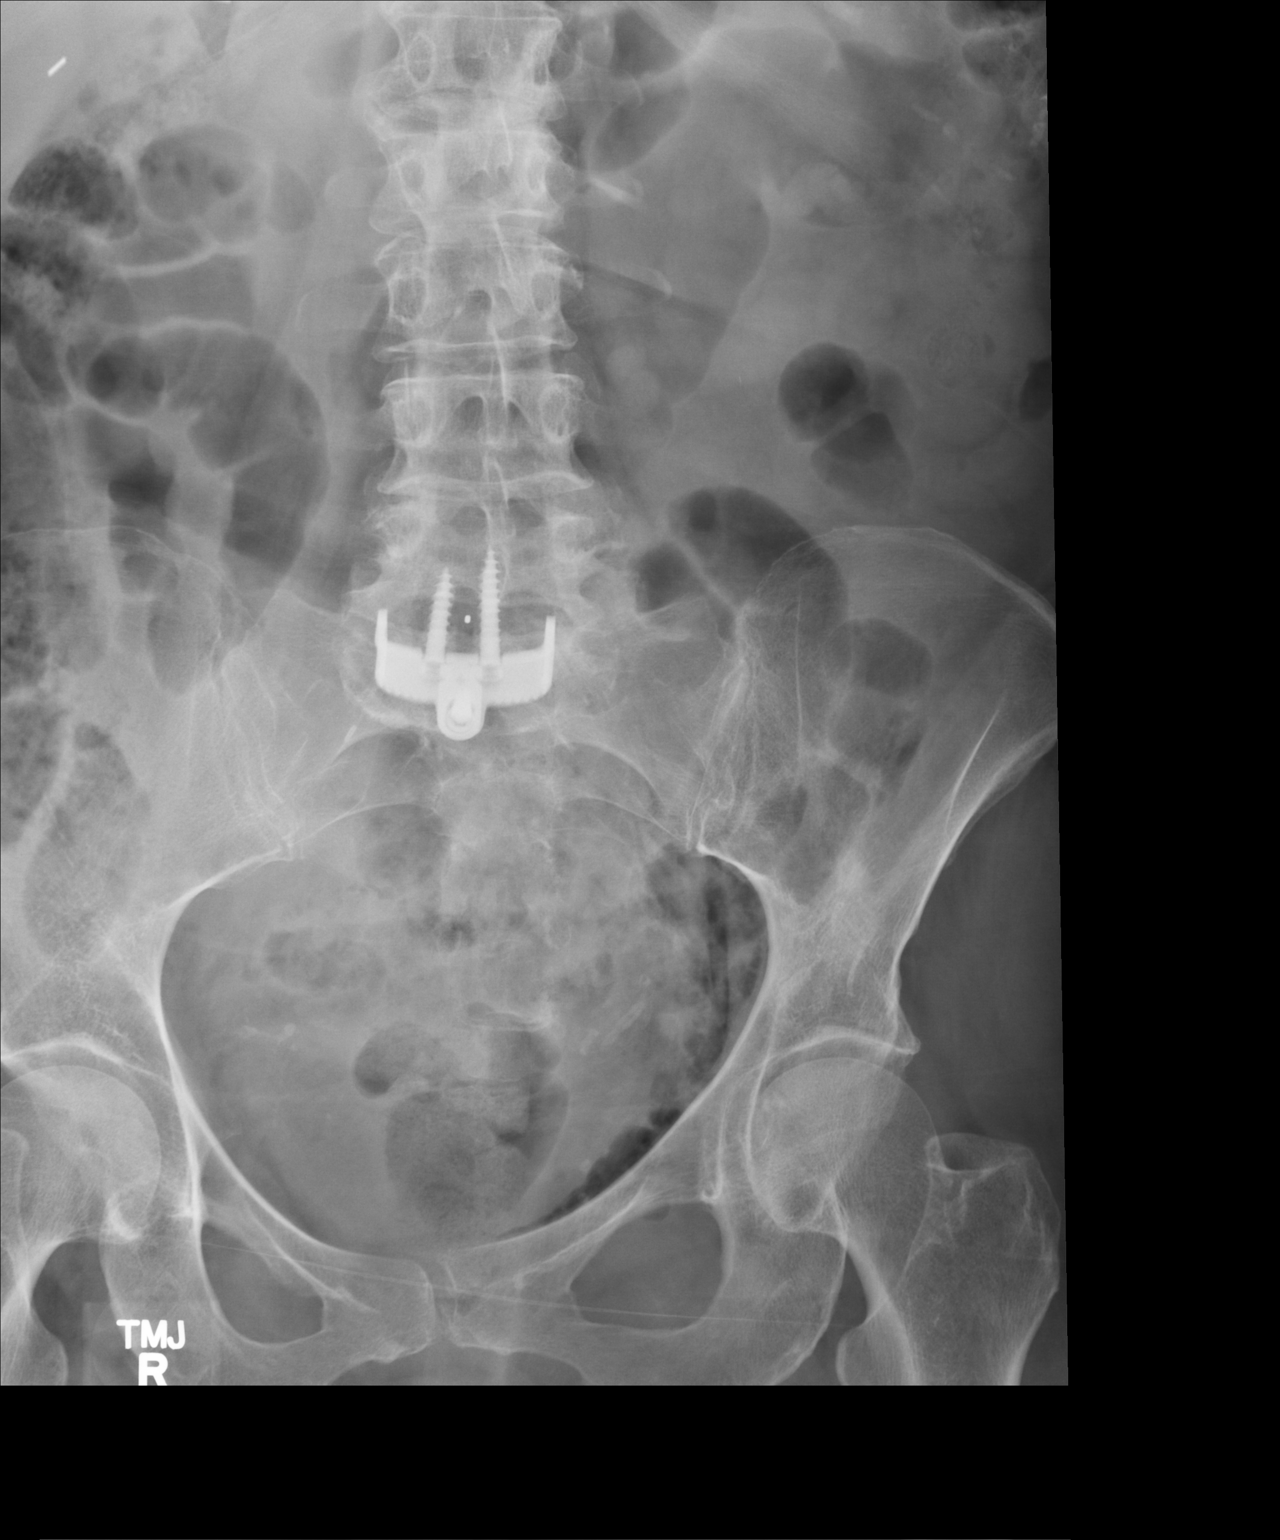

[1 of 1 positions shown; findings below may reference images not displayed]

FINDINGS: One view abdomen demonstrates interbody fusion device at L5-S1. No
unexpected radiopaque foreign bodies are identified.
IMPRESSION: No unexpected radiopaque foreign bodies.

Report called to the OR.

## 2019-06-30 IMAGING — CT CT BIOPSY
1 of 4 series · 9 of 32 positions shown, 15 images · non-contrast
Comparison: none

CLINICAL DATA: Post-laminectomy syndrome. Posterior pelvic pain.
Right leg pain.

[Series 2: needle -guided injection · axial · 0.62mm/px · z∈[-166,-130]mm · 9 of 24 slices shown, 15 images]
[im 3/24  soft-tissue]
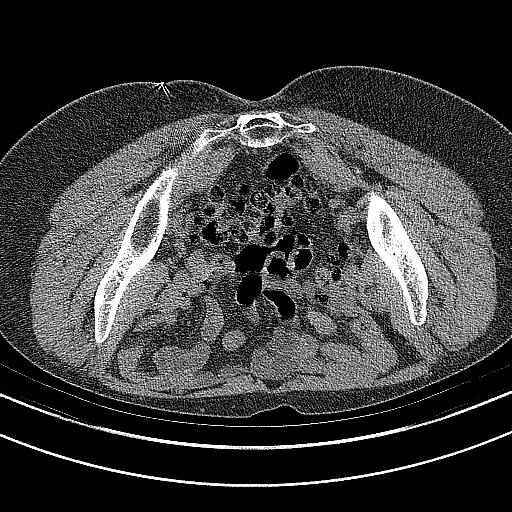
[im 3/24  bone]
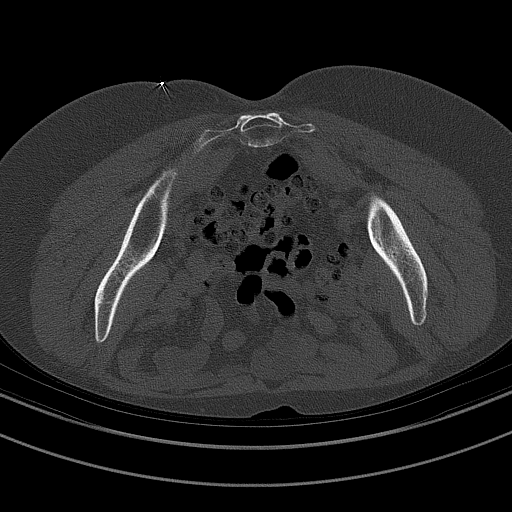
[im 5/24  soft-tissue]
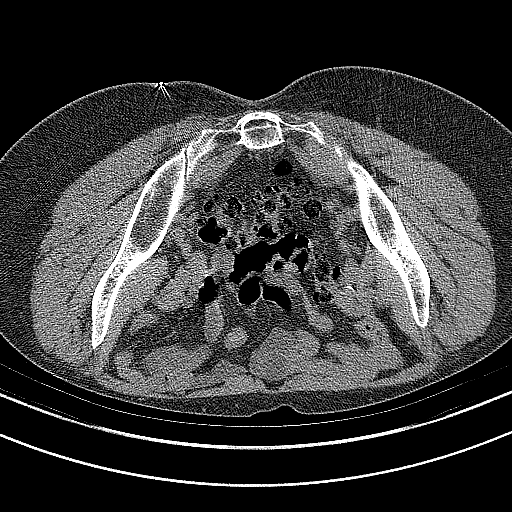
[im 7/24  soft-tissue]
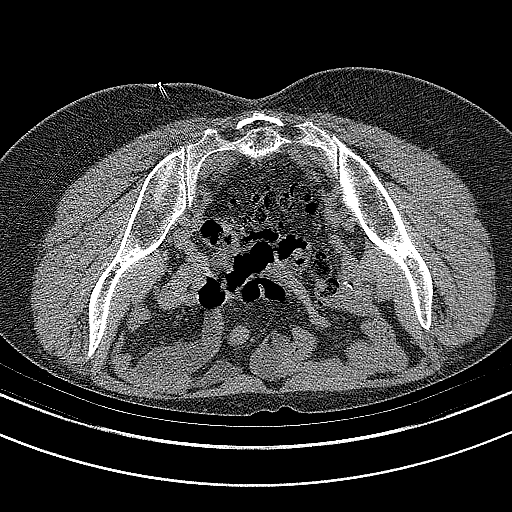
[im 10/24  soft-tissue]
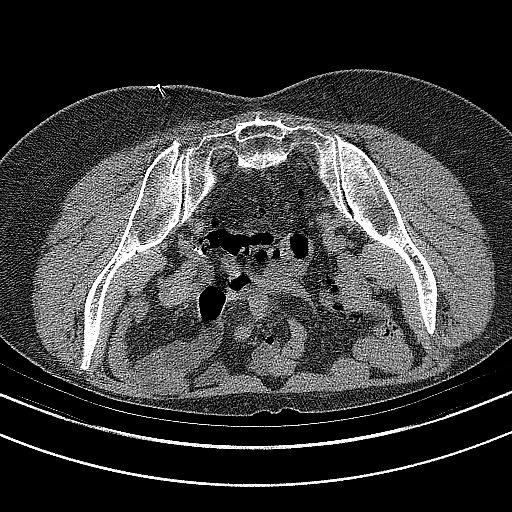
[im 12/24  soft-tissue]
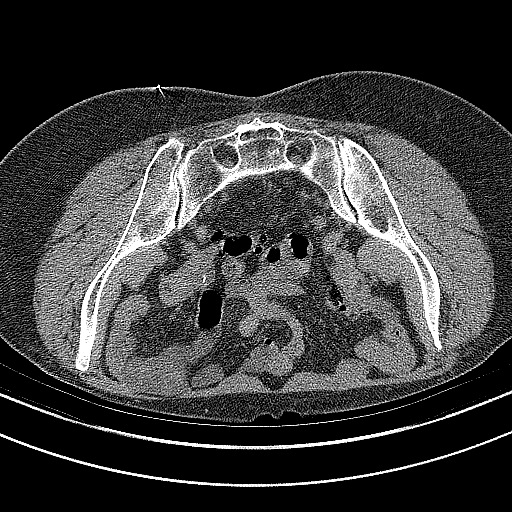
[im 14/24  soft-tissue]
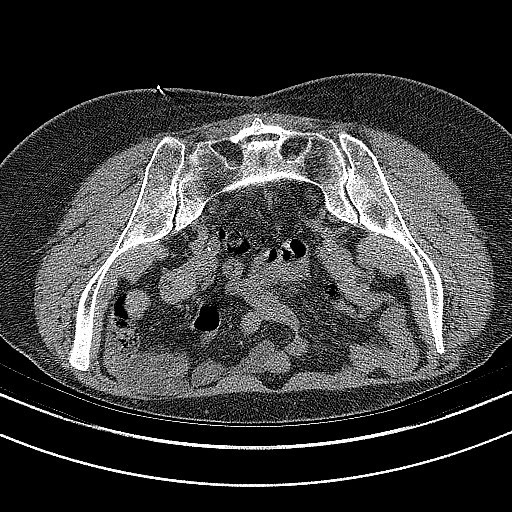
[im 14/24  lung]
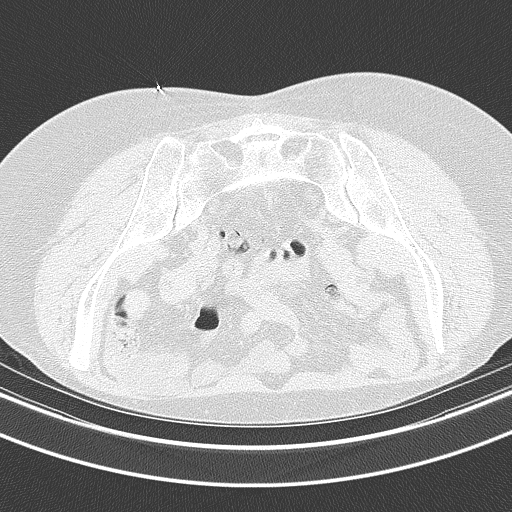
[im 17/24  soft-tissue]
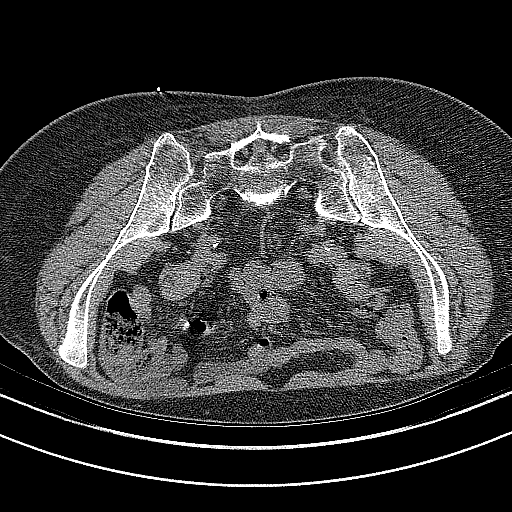
[im 17/24  lung]
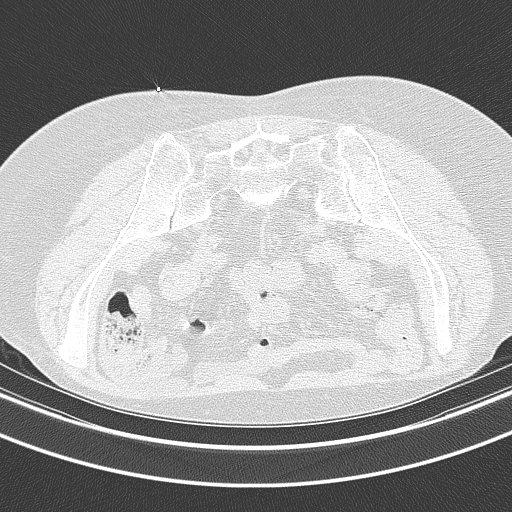
[im 19/24  soft-tissue]
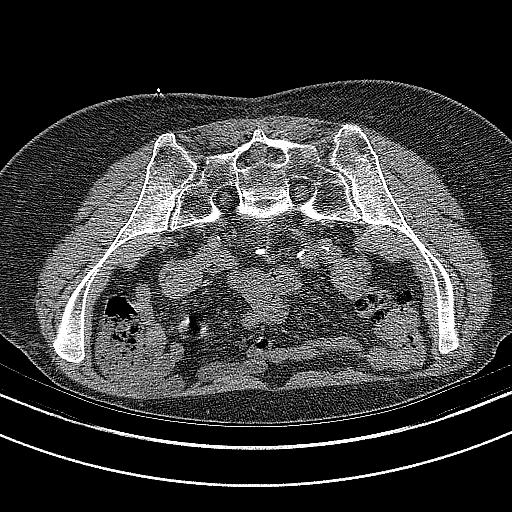
[im 19/24  lung]
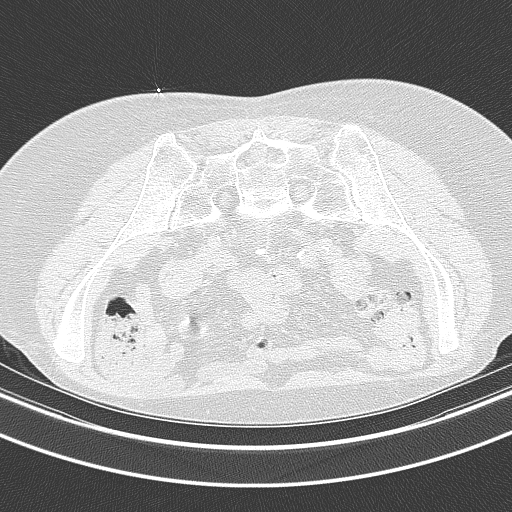
[im 21/24  soft-tissue]
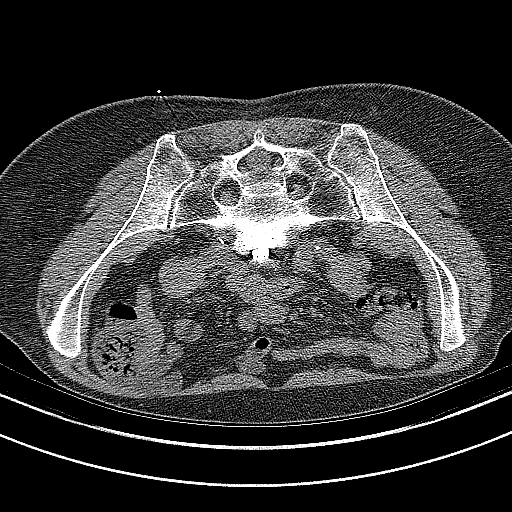
[im 21/24  lung]
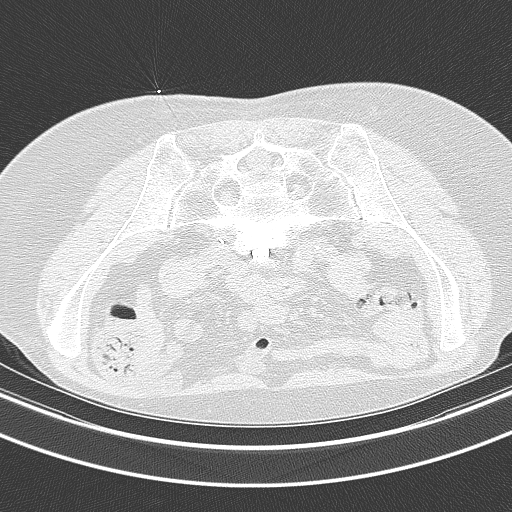
[im 21/24  bone]
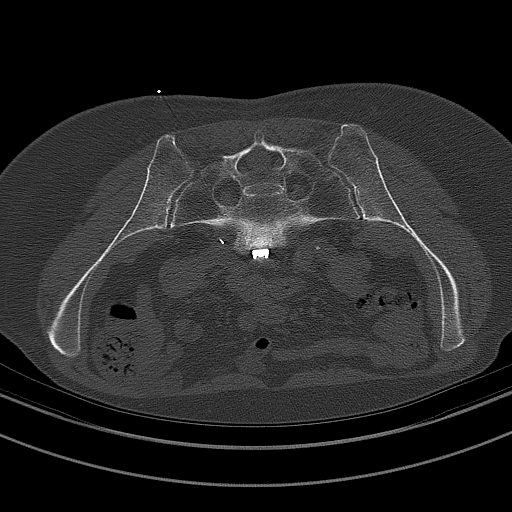

[9 of 32 positions shown; findings below may reference images not displayed]

EXAM:
Right CT GUIDED SI JOINT INJECTION



After local anesthesia with 1% lidocaine without epinephrine and
subsequent deep anesthesia, a 22 gauge spinal needle was advanced
into the right SI joint under intermittent CT guidance.

Once the needle was in satisfactory position, representative image
was captured with the needle demonstrated in the sacroiliac joint.
Subsequently, 120 mg Depo-Medrol and 1.5 mL 1% lidocaine was
injected into the right SI joint. Needles removed and a sterile
dressing applied.

No complications were observed.
IMPRESSION: Successful CT-guided steroid and lidocaine right SI joint injection.

## 2019-08-25 IMAGING — RF DG C-ARM 61-120 MIN
1 series · 4 of 4 positions shown · non-contrast
Comparison: 09/20/2017 sacroiliac joint CT guided intervention

CLINICAL DATA: Right sacroiliac joint fusion

EXAM:
BILATERAL SACROILIAC JOINTS - 3+ VIEW; DG C-ARM 61-120 MIN

[Series 1: run · 4 of 4 slices shown]
[im 1/4]
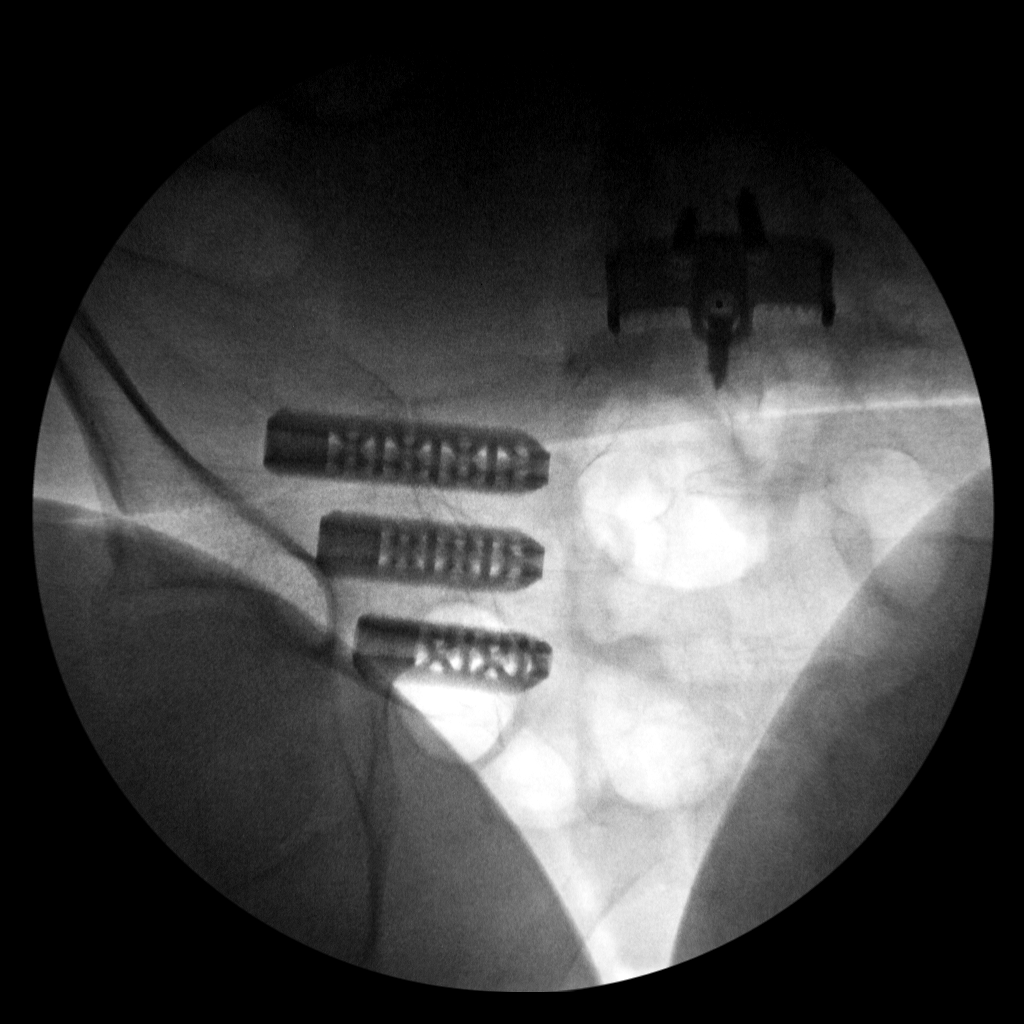
[im 2/4]
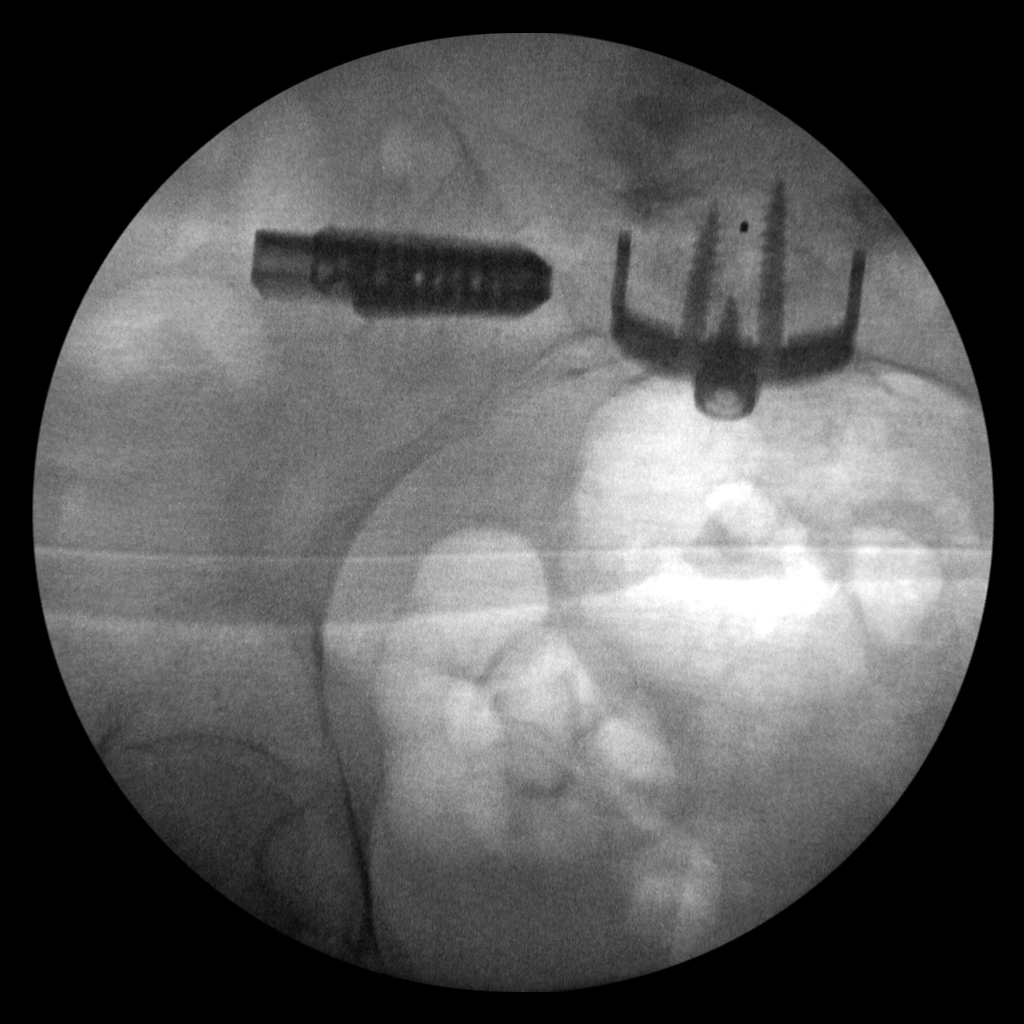
[im 3/4]
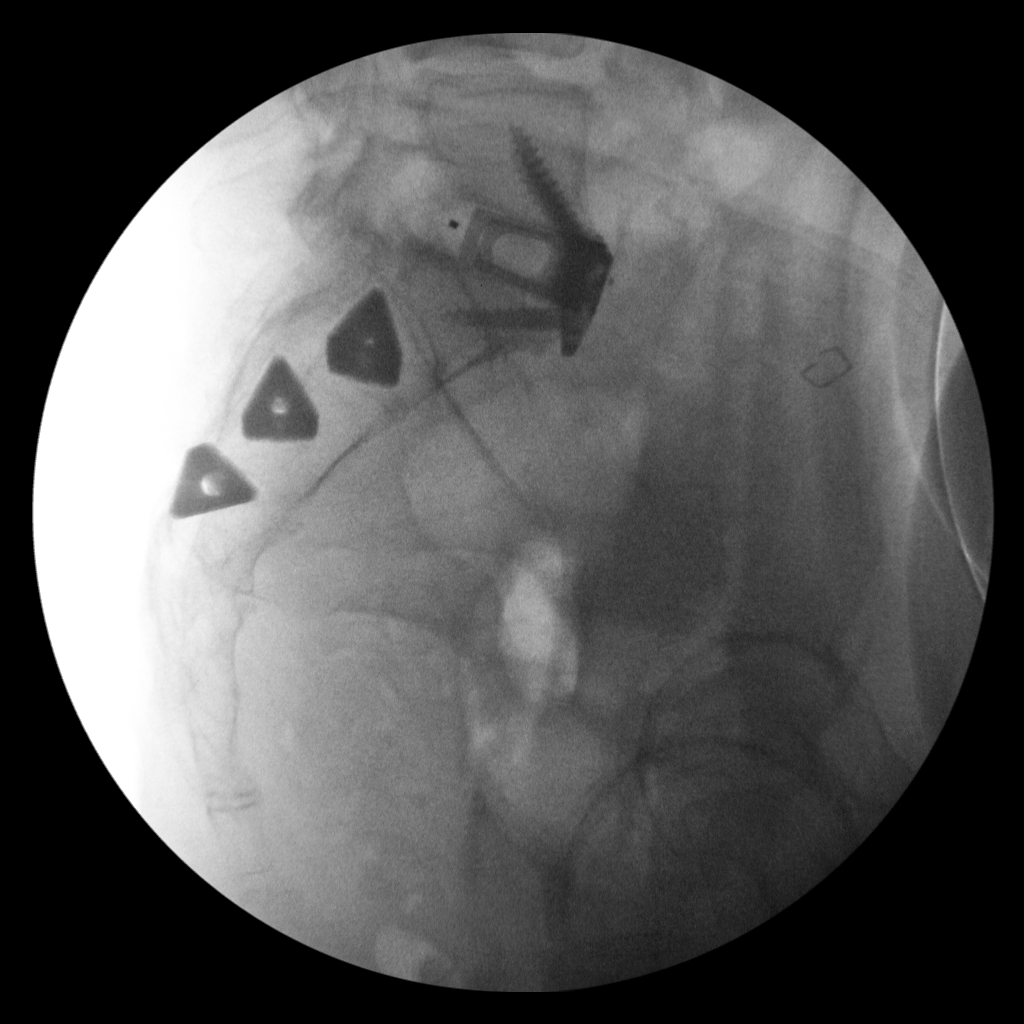
[im 4/4]
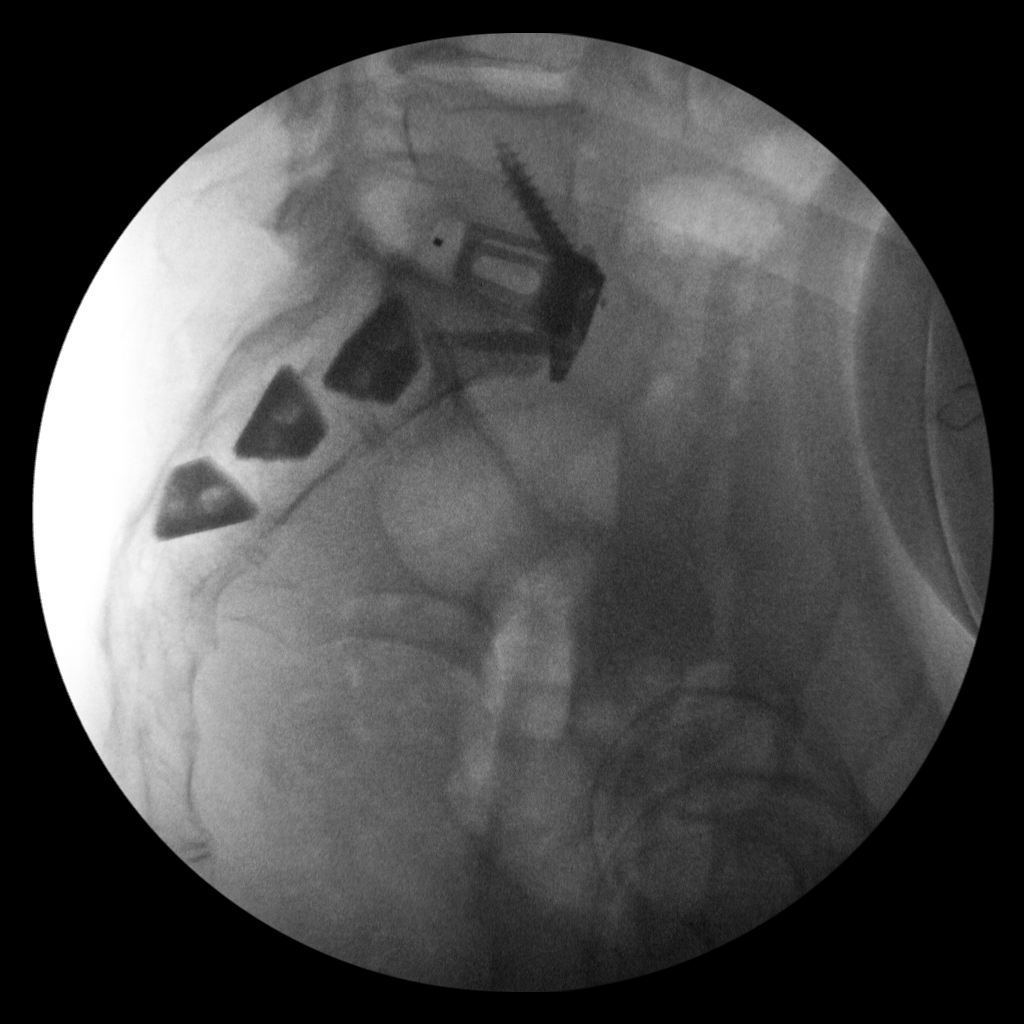

[4 of 4 positions shown; findings below may reference images not displayed]

FINDINGS: Fluoroscopy time 2 minutes 53 seconds. Multiple spot fluoroscopic
nondiagnostic intraoperative radiographs of the right sacroiliac
joint demonstrate transfixation of the right sacroiliac joint with 3
pins. Previous anterior spinal fusion at L5-S1.
IMPRESSION: Intraoperative fluoroscopic guidance for right sacroiliac joint
fusion.
